# Patient Record
Sex: Female | Born: 1945 | Race: White | Hispanic: No | Smoking: Never smoker
Health system: Southern US, Community
[De-identification: ages and names within clinical notes are randomized; demographics above are authoritative.]

## PROBLEM LIST (undated history)

## (undated) DIAGNOSIS — E785 Hyperlipidemia, unspecified: Secondary | ICD-10-CM

## (undated) DIAGNOSIS — E079 Disorder of thyroid, unspecified: Secondary | ICD-10-CM

## (undated) DIAGNOSIS — I10 Essential (primary) hypertension: Secondary | ICD-10-CM

## (undated) DIAGNOSIS — M8580 Other specified disorders of bone density and structure, unspecified site: Secondary | ICD-10-CM

## (undated) DIAGNOSIS — C801 Malignant (primary) neoplasm, unspecified: Secondary | ICD-10-CM

## (undated) DIAGNOSIS — G43909 Migraine, unspecified, not intractable, without status migrainosus: Secondary | ICD-10-CM

## (undated) DIAGNOSIS — H269 Unspecified cataract: Secondary | ICD-10-CM

## (undated) HISTORY — DX: Unspecified cataract: H26.9

## (undated) HISTORY — DX: Disorder of thyroid, unspecified: E07.9

## (undated) HISTORY — DX: Hyperlipidemia, unspecified: E78.5

## (undated) HISTORY — PX: CATARACT EXTRACTION: SUR2

## (undated) HISTORY — DX: Essential (primary) hypertension: I10

## (undated) HISTORY — DX: Other specified disorders of bone density and structure, unspecified site: M85.80

## (undated) HISTORY — PX: BREAST SURGERY: SHX581

## (undated) HISTORY — DX: Migraine, unspecified, not intractable, without status migrainosus: G43.909

## (undated) HISTORY — PX: COLONOSCOPY: SHX174

---

## 1980-01-05 HISTORY — PX: NECK SURGERY: SHX720

## 2001-08-17 HISTORY — PX: BREAST EXCISIONAL BIOPSY: SUR124

## 2005-02-11 ENCOUNTER — Ambulatory Visit: Payer: Self-pay | Admitting: Internal Medicine

## 2005-02-19 ENCOUNTER — Ambulatory Visit: Payer: Self-pay | Admitting: Internal Medicine

## 2006-05-05 ENCOUNTER — Ambulatory Visit: Payer: Self-pay | Admitting: Internal Medicine

## 2006-10-06 DIAGNOSIS — Z860100 Personal history of colon polyps, unspecified: Secondary | ICD-10-CM | POA: Insufficient documentation

## 2006-12-14 ENCOUNTER — Ambulatory Visit: Payer: Self-pay | Admitting: Internal Medicine

## 2006-12-27 ENCOUNTER — Telehealth (INDEPENDENT_AMBULATORY_CARE_PROVIDER_SITE_OTHER): Payer: Self-pay | Admitting: *Deleted

## 2007-03-20 ENCOUNTER — Ambulatory Visit: Payer: Self-pay | Admitting: Internal Medicine

## 2007-12-04 ENCOUNTER — Encounter: Payer: Self-pay | Admitting: Gynecology

## 2007-12-04 ENCOUNTER — Ambulatory Visit: Payer: Self-pay | Admitting: Gynecology

## 2007-12-04 ENCOUNTER — Other Ambulatory Visit: Admission: RE | Admit: 2007-12-04 | Discharge: 2007-12-04 | Payer: Self-pay | Admitting: Gynecology

## 2007-12-07 ENCOUNTER — Ambulatory Visit: Payer: Self-pay | Admitting: Gynecology

## 2008-01-01 ENCOUNTER — Ambulatory Visit: Payer: Self-pay | Admitting: Family Medicine

## 2008-01-01 DIAGNOSIS — G43909 Migraine, unspecified, not intractable, without status migrainosus: Secondary | ICD-10-CM | POA: Insufficient documentation

## 2008-03-01 ENCOUNTER — Ambulatory Visit: Payer: Self-pay | Admitting: Gynecology

## 2008-03-04 ENCOUNTER — Ambulatory Visit: Payer: Self-pay | Admitting: Gynecology

## 2008-12-09 ENCOUNTER — Encounter (INDEPENDENT_AMBULATORY_CARE_PROVIDER_SITE_OTHER): Payer: Self-pay | Admitting: *Deleted

## 2008-12-09 ENCOUNTER — Other Ambulatory Visit: Admission: RE | Admit: 2008-12-09 | Discharge: 2008-12-09 | Payer: Self-pay | Admitting: Gynecology

## 2008-12-09 ENCOUNTER — Encounter: Payer: Self-pay | Admitting: Internal Medicine

## 2008-12-09 ENCOUNTER — Ambulatory Visit: Payer: Self-pay | Admitting: Gynecology

## 2008-12-24 ENCOUNTER — Encounter: Admission: RE | Admit: 2008-12-24 | Discharge: 2008-12-24 | Payer: Self-pay | Admitting: Gynecology

## 2009-01-10 ENCOUNTER — Encounter (INDEPENDENT_AMBULATORY_CARE_PROVIDER_SITE_OTHER): Payer: Self-pay | Admitting: *Deleted

## 2009-01-10 ENCOUNTER — Ambulatory Visit: Payer: Self-pay | Admitting: Internal Medicine

## 2009-01-21 ENCOUNTER — Encounter (INDEPENDENT_AMBULATORY_CARE_PROVIDER_SITE_OTHER): Payer: Self-pay | Admitting: *Deleted

## 2009-01-24 ENCOUNTER — Ambulatory Visit: Payer: Self-pay | Admitting: Gastroenterology

## 2009-02-07 ENCOUNTER — Ambulatory Visit: Payer: Self-pay | Admitting: Gastroenterology

## 2009-02-25 ENCOUNTER — Ambulatory Visit: Payer: Self-pay | Admitting: Family Medicine

## 2009-02-25 DIAGNOSIS — J029 Acute pharyngitis, unspecified: Secondary | ICD-10-CM

## 2009-02-25 LAB — CONVERTED CEMR LAB: Rapid Strep: NEGATIVE

## 2010-02-03 NOTE — Miscellaneous (Signed)
Summary: [facility]  Clinical Lists Changes  Medications: Added new medication of MOVIPREP 100 GM  SOLR (PEG-KCL-NACL-NASULF-NA ASC-C) As per prep instructions. - Signed Rx of MOVIPREP 100 GM  SOLR (PEG-KCL-NACL-NASULF-NA ASC-C) As per prep instructions.;  #1 x 0;  Signed;  Entered by: Ezra Sites RN;  Authorized by: Louis Meckel MD;  Method used: Electronically to [facility] # [phone]*, [address] [place], [place]  [address], Ph: [phone], Fax: [phone] Allergies: Changed allergy or adverse reaction from BIAXIN to [facility] Changed allergy or adverse reaction from * EVISTA to * EVISTA Added new allergy or adverse reaction of * BACITRACIN EYE DROPS    Prescriptions: MOVIPREP 100 GM  SOLR (PEG-KCL-NACL-NASULF-NA ASC-C) As per prep instructions.  #1 x 0   Entered by:   Ezra Sites RN   Authorized by:   Louis Meckel MD   Signed by:   Ezra Sites RN on 01/24/2009   Method used:   Electronically to        [facility] # [phone]* (retail)       [address]       [place], [place]  [address]       Ph: [phone]       Fax: [phone]   RxID:   [id number]

## 2010-02-03 NOTE — Assessment & Plan Note (Signed)
Summary: fever/sore throat/dm   Vital Signs:  Patient profile:   65 year old female Temp:     99.1 degrees F oral BP sitting:   140 / 60  (left arm) Cuff size:   regular  Vitals Entered By: Sid Falcon LPN (February 25, 2009 9:42 AM) CC: sore throat, fever, cough X 2 days   History of Present Illness: Acute visit. Onset about 3 days ago of sore throat and cough. Cough mostly nonproductive. Yesterday developed sore throat. Fever to 101 last night. Minimal nasal congestion. Mild body aches. Started plain Mucinex yesterday. Patient is nonsmoker. Denies nausea, vomiting, or diarrhea  Allergies: 1)  ! Biaxin 2)  ! * Evista 3)  ! Fish Oil (Omega-3 Fatty Acids) 4)  ! * Bacitracin Eye Drops  Past History:  Past Medical History: Last updated: 01/10/2009 [facility] Colonic polyps, hx of Glaucoma High Cholesterol PMH reviewed for relevance  Review of Systems      See HPI  Physical Exam  General:  Well-developed,well-nourished,in no acute distress; alert,appropriate and cooperative throughout examination Ears:  External ear exam shows no significant lesions or deformities.  Otoscopic examination reveals clear canals, tympanic membranes are intact bilaterally without bulging, retraction, inflammation or discharge. Hearing is grossly normal bilaterally. Nose:  External nasal examination shows no deformity or inflammation. Nasal mucosa are pink and moist without lesions or exudates. Mouth:  minimal erythema without exudate Neck:  No deformities, masses, or tenderness noted. Lungs:  Normal respiratory effort, chest expands symmetrically. Lungs are clear to auscultation, no crackles or wheezes. Heart:  Normal rate and regular rhythm. S1 and S2 normal without gallop, murmur, click, rub or other extra sounds. Skin:  no rashes Cervical Nodes:  No lymphadenopathy noted   Impression & Recommendations:  Problem # 1:  VIRAL INFECTION (ICD-079.99) start mucinex.  No indication for antibiotics  at this time.  Rapid strep neg.  Complete Medication List: 1)  Imitrex 50 Mg Tabs (Sumatriptan succinate) .... Take 1 tablet by mouth once a day as needed 2)  Vitamin D 2000 Unit Tabs (Cholecalciferol) .... Once daily 3)  Zyrtec Allergy 10 Mg Tabs (Cetirizine hcl) .... As needed itching per dr Laney Pastor  Other Orders: Rapid Strep ([address])  Patient Instructions: 1)  Get plenty of rest, drink lots of clear liquids, and use Tylenol or Ibuprofen for fever and comfort. Return in 7-10 days if you're not better: sooner if you'er feeling worse.  2)  continue Mucinex 2 tablets twice daily  Laboratory Results  Date/Time Received: February 25, 2009 9:54 AM  Date/Time Reported: February 25, 2009 9:53 AM   Other Tests  Rapid Strep: negative Comments Wynona Canes, CMA  February 25, 2009 9:54 AM   Kit Test Internal QC: Negative   (Normal Range: Negative)

## 2010-02-03 NOTE — Procedures (Signed)
Summary: Colonoscopy  Patient: [name] Note: All result statuses are Final unless otherwise noted.  Tests: (1) Colonoscopy (COL)   COL Colonoscopy           DONE     [facility]     [address]     [place], [place]  [address]           COLONOSCOPY PROCEDURE REPORT           PATIENT:  Holly Blanchard, Holly Blanchard  MR#:  [id number]     BIRTHDATE:  May 28, 1945, 63 yrs. old  GENDER:  female           ENDOSCOPIST:  Barbette Hair. Arlyce Dice, MD     Referred by:  Birdie Sons, M.D.           PROCEDURE DATE:  02/07/2009     PROCEDURE:  Colonoscopy, Diagnostic     ASA CLASS:  Class I     INDICATIONS:  history of pre-cancerous (adenomatous) colon polyps                 MEDICATIONS:   Fentanyl 75 mcg IV, Versed 8 mg IV           DESCRIPTION OF PROCEDURE:   After the risks benefits and     alternatives of the procedure were thoroughly explained, informed     consent was obtained.  Digital rectal exam was performed and     revealed no abnormalities.   The LB CF-[id number] endoscope     was introduced through the anus and advanced to the cecum, which     was identified by both the appendix and ileocecal valve, without     limitations.  The quality of the prep was excellent, using     MoviPrep.  The instrument was then slowly withdrawn as the colon     was fully examined.     <<PROCEDUREIMAGES>>           FINDINGS:  A normal appearing cecum, ileocecal valve, and     appendiceal orifice were identified. The ascending, hepatic     flexure, transverse, splenic flexure, descending, sigmoid colon,     and rectum appeared unremarkable (see image2, image4, image7,     image8, image9, image12, image14, and image16).   Retroflexed     views in the rectum revealed no abnormalities.    The scope was     then withdrawn from the patient and the procedure completed.           COMPLICATIONS:  None           ENDOSCOPIC IMPRESSION:     1) Normal colon     RECOMMENDATIONS:     1) colonoscopy in 10 years          REPEAT EXAM:  In 10 year(s) for Colonoscopy.           ______________________________     Barbette Hair. Arlyce Dice, MD           CC:           n.     eSIGNED:   Barbette Hair. Inette Doubrava at 02/07/2009 10:14 AM           Charletta Cousin, [id number]  Note: An exclamation mark (!) indicates a result that was not dispersed into the flowsheet. Document Creation Date: 02/07/2009 10:14 AM _______________________________________________________________________  (1) Order result status: Final Collection or observation date-time: 02/07/2009 10:08 Requested date-time:  Receipt date-time:  Reported date-time:  Referring Physician:   Ordering Physician: Melvia Heaps [phone]) Specimen Source:  Source: Launa Grill Order Number: [id number] Lab site:   Appended Document: Colonoscopy    Clinical Lists Changes  Observations: Added new observation of COLONNXTDUE: 02/2019 (02/07/2009 11:12)

## 2010-02-03 NOTE — Assessment & Plan Note (Signed)
Summary: rov/mm   Vital Signs:  Patient profile:   65 year old female Weight:      131 pounds Temp:     98.2 degrees F Pulse rate:   80 / minute Resp:     12 per minute BP sitting:   142 / 80  (left arm)  Vitals Entered By: Gladis Riffle, RN (January 10, 2009 11:00 AM)   History of Present Illness:  Follow-Up Visit      This is a 65 year old woman who presents for Follow-up visit.  The patient denies chest pain, palpitations, dizziness, syncope, edema, SOB, DOE, PND, and orthopnea.  Since the last visit the patient notes being seen by a specialist.  The patient reports taking meds as prescribed.  When questioned about possible medication side effects, the patient notes none.    home BPs 100s-135/70s  Preventive Screening-Counseling & Management  Alcohol-Tobacco     Smoking Status: never  Current Problems (verified): 1)  Migraine Headache  (ICD-346.90) 2)  Colonic Polyps, Hx of  (ICD-V12.72) 3)  Breast Cancer, Hx of  (ICD-V10.3)  Current Medications (verified): 1)  Imitrex 50 Mg Tabs (Sumatriptan Succinate) .... Take 1 Tablet By Mouth Once A Day As Needed 2)  Vitamin D 2000 Unit Tabs (Cholecalciferol) .... Once Daily 3)  Zyrtec Allergy 10 Mg Tabs (Cetirizine Hcl) .... As Needed Itching Per Dr Laney Pastor  Allergies: 1)  ! Biaxin 2)  ! * Evista 3)  ! Fish Oil (Omega-3 Fatty Acids)  Comments:  Nurse/Medical Assistant: elevated BP intermittently x 1 year; BP 138/72-144/72 at home, 107/62 last night--had regular coffee this AM  The patient's medications and allergies were reviewed with the patient and were updated in the Medication and Allergy Lists. Gladis Riffle, RN (January 10, 2009 11:04 AM)  Past History:  Family History: Last updated: 01/10/2009 Family History Diabetes 1st degree relative--father father deceased---lung Family History of Colon CA---aunt Family History Hypertension Family History Lung cancer--father mother--htn  Social History: Last updated:  10/06/2006 Occupation: Married Never Smoked Alcohol use-yes Regular exercise-yes Drug use-no  Risk Factors: Exercise: yes (10/06/2006)  Risk Factors: Smoking Status: never (01/10/2009)  Past Medical History: MHA Colonic polyps, hx of Glaucoma High Cholesterol  Past Surgical History: Breast Bx--2003 benign  Family History: Family History Diabetes 1st degree relative--father father deceased---lung Family History of Colon CA---aunt Family History Hypertension Family History Lung cancer--father mother--htn  Physical Exam  General:  Well-developed,well-nourished,in no acute distress; alert,appropriate and cooperative throughout examination Head:  normocephalic and atraumatic.   Eyes:  pupils equal and pupils round.   Ears:  R ear normal and L ear normal.   Neck:  No deformities, masses, or tenderness noted. Lungs:  rhochi upper chest,  no rales Abdomen:  Bowel sounds positive,abdomen soft and non-tender without masses, organomegaly or hernias noted. Neurologic:  cranial nerves II-XII intact and gait normal.     Impression & Recommendations:  Problem # 1:  MIGRAINE HEADACHE (ICD-346.90) well contrlled continue current medications  Her updated medication list for this problem includes:    Imitrex 50 Mg Tabs (Sumatriptan succinate) .Marland Kitchen... Take 1 tablet by mouth once a day as needed insomnia---trial melatonin  Problem # 3:  COLONIC POLYPS, HX OF (ICD-V12.72) will f/u conolonscopy  Complete Medication List: 1)  Imitrex 50 Mg Tabs (Sumatriptan succinate) .... Take 1 tablet by mouth once a day as needed 2)  Vitamin D 2000 Unit Tabs (Cholecalciferol) .... Once daily 3)  Zyrtec Allergy 10 Mg Tabs (Cetirizine hcl) .Marland KitchenMarland KitchenMarland Kitchen  As needed itching per dr Laney Pastor  Other Orders: Gastroenterology Referral (GI) Prescriptions: IMITREX 50 MG TABS (SUMATRIPTAN SUCCINATE) Take 1 tablet by mouth once a day as needed  #9 x 3   Entered and Authorized by:   Birdie Sons MD   Signed by:   Birdie Sons MD on 01/10/2009   Method used:   Electronically to        [facility] # [phone]* (retail)       [address]       [place], [place]  [address]       Ph: [phone]       Fax: [phone]   RxID:   [id number]    Preventive Care Screening  Bone Density:    Date:  01/05/2008    Next Due:  01/2010    Results:  Dagoberto Reef std dev  Colonoscopy:    Date:  12/05/2002    Next Due:  12/2007    Results:  normal-pt's report   Pap Smear:    Date:  12/04/2008    Next Due:  12/2011    Results:  normal-pt's report

## 2010-02-03 NOTE — Letter (Signed)
Summary: Previsit letter  [facility]  [address] [place], [place] [address]   Phone: [phone]  Fax: [phone]       01/10/2009 MRN: [id number]  [facility] [address] [place], [place]  [address]  Dear Holly Blanchard,  Welcome to the Gastroenterology Division at [facility].    You are scheduled to see a nurse for your pre-procedure visit on 01-24-09 at 11AM on the 3rd floor at [facility], [address]  We ask that you try to arrive at our office 15 minutes prior to your appointment time to allow for check-in.  Your nurse visit will consist of discussing your medical and surgical history, your immediate family medical history, and your medications.    Please bring a complete list of all your medications or, if you prefer, bring the medication bottles and we will list them.  We will need to be aware of both prescribed and over the counter drugs.  We will need to know exact dosage information as well.  If you are on blood thinners (Coumadin, Plavix, Aggrenox, Ticlid, etc.) please call our office today/prior to your appointment, as we need to consult with your physician about holding your medication.   Please be prepared to read and sign documents such as consent forms, a financial agreement, and acknowledgement forms.  If necessary, and with your consent, a friend or relative is welcome to sit-in on the nurse visit with you.  Please bring your insurance card so that we may make a copy of it.  If your insurance requires a referral to see a specialist, please bring your referral form from your primary care physician.  No co-pay is required for this nurse visit.     If you cannot keep your appointment, please call [phone] to cancel or reschedule prior to your appointment date.  This allows [place] the opportunity to schedule an appointment for another patient in need of care.    Thank you for choosing [facility] for your medical needs.  We  appreciate the opportunity to care for you.  Please visit [place] at our website  to learn more about our practice.                     Sincerely.                                                                                                                   The Gastroenterology Division

## 2010-02-03 NOTE — Letter (Signed)
Summary: [facility] Instructions  [facility]  [address] [place], [place] [address]   Phone: [phone]  Fax: [phone]       Holly Blanchard    24-Apr-1945    MRN: [id number]        Procedure Day /Date:  Friday 02/04     Arrival Time:  8:00am     Procedure Time:  9:00am     Location of Procedure:                    _ X_  [facility] (4th Floor)                        PREPARATION FOR COLONOSCOPY WITH MOVIPREP   Starting 5 days prior to your procedure  Sunday 01/30  do not eat nuts, seeds, popcorn, corn, beans, peas,  salads, or any raw vegetables.  Do not take any fiber supplements (e.g. Metamucil, Citrucel, and Benefiber).  THE DAY BEFORE YOUR PROCEDURE         DATE:  02/03   DAY: Thursday 1.  Drink clear liquids the entire day-NO SOLID FOOD  2.  Do not drink anything colored red or purple.  Avoid juices with pulp.  No orange juice.  3.  Drink at least 64 oz. (8 glasses) of fluid/clear liquids during the day to prevent dehydration and help the prep work efficiently.  CLEAR LIQUIDS INCLUDE: Water Jello Ice Popsicles Tea (sugar ok, no milk/cream) Powdered fruit flavored drinks Coffee (sugar ok, no milk/cream) Gatorade Juice: apple, white grape, white cranberry  Lemonade Clear bullion, consomm, broth Carbonated beverages (any kind) Strained chicken noodle soup Hard Candy                             4.  In the morning, mix first dose of MoviPrep solution:    Empty 1 Pouch A and 1 Pouch B into the disposable container    Add lukewarm drinking water to the top line of the container. Mix to dissolve    Refrigerate (mixed solution should be used within 24 hrs)  5.  Begin drinking the prep at 5:00 p.m. The MoviPrep container is divided by 4 marks.   Every 15 minutes drink the solution down to the next mark (approximately 8 oz) until the full liter is complete.   6.  Follow completed prep with 16 oz of clear liquid of your choice (Nothing  red or purple).  Continue to drink clear liquids until bedtime.  7.  Before going to bed, mix second dose of MoviPrep solution:    Empty 1 Pouch A and 1 Pouch B into the disposable container    Add lukewarm drinking water to the top line of the container. Mix to dissolve    Refrigerate  THE DAY OF YOUR PROCEDURE      DATE:  02/04  DAY:  Friday  Beginning at  4:00 a.m. (5 hours before procedure):         1. Every 15 minutes, drink the solution down to the next mark (approx 8 oz) until the full liter is complete.  2. Follow completed prep with 16 oz. of clear liquid of your choice.    3. You may drink clear liquids until  7:00am (2 HOURS BEFORE PROCEDURE).   MEDICATION INSTRUCTIONS  Unless otherwise instructed, you should take regular prescription medications with a small sip of water  as early as possible the morning of your procedure.           OTHER INSTRUCTIONS  You will need a responsible adult at least 65 years of age to accompany you and drive you home.   This person must remain in the waiting room during your procedure.  Wear loose fitting clothing that is easily removed.  Leave jewelry and other valuables at home.  However, you may wish to bring a book to read or  an iPod/MP3 player to listen to music as you wait for your procedure to start.  Remove all body piercing jewelry and leave at home.  Total time from sign-in until discharge is approximately 2-3 hours.  You should go home directly after your procedure and rest.  You can resume normal activities the  day after your procedure.  The day of your procedure you should not:   Drive   Make legal decisions   Operate machinery   Drink alcohol   Return to work  You will receive specific instructions about eating, activities and medications before you leave.    The above instructions have been reviewed and explained to me by   Ezra Sites RN  January 24, 2009 11:25 AM     I fully understand  and can verbalize these instructions _____________________________ Date _________

## 2010-02-19 ENCOUNTER — Other Ambulatory Visit: Payer: Self-pay | Admitting: Gynecology

## 2010-02-19 DIAGNOSIS — Z1231 Encounter for screening mammogram for malignant neoplasm of breast: Secondary | ICD-10-CM

## 2010-02-24 ENCOUNTER — Encounter: Payer: Self-pay | Admitting: Gynecology

## 2010-02-27 ENCOUNTER — Ambulatory Visit
Admission: RE | Admit: 2010-02-27 | Discharge: 2010-02-27 | Disposition: A | Discharge status: Home / Self Care | Payer: Managed Care, Other (non HMO) | Source: Ambulatory Visit | Attending: Gynecology | Admitting: Gynecology

## 2010-02-27 DIAGNOSIS — Z1231 Encounter for screening mammogram for malignant neoplasm of breast: Secondary | ICD-10-CM

## 2010-03-02 ENCOUNTER — Encounter (INDEPENDENT_AMBULATORY_CARE_PROVIDER_SITE_OTHER): Payer: Managed Care, Other (non HMO) | Admitting: Gynecology

## 2010-03-02 ENCOUNTER — Other Ambulatory Visit: Payer: Self-pay | Admitting: Gynecology

## 2010-03-02 ENCOUNTER — Other Ambulatory Visit (HOSPITAL_COMMUNITY)
Admission: RE | Admit: 2010-03-02 | Discharge: 2010-03-02 | Disposition: A | Discharge status: Home / Self Care | Payer: Managed Care, Other (non HMO) | Source: Ambulatory Visit | Attending: Gynecology | Admitting: Gynecology

## 2010-03-02 DIAGNOSIS — Z1211 Encounter for screening for malignant neoplasm of colon: Secondary | ICD-10-CM

## 2010-03-02 DIAGNOSIS — Z124 Encounter for screening for malignant neoplasm of cervix: Secondary | ICD-10-CM | POA: Insufficient documentation

## 2010-03-02 DIAGNOSIS — Z01419 Encounter for gynecological examination (general) (routine) without abnormal findings: Secondary | ICD-10-CM

## 2010-03-24 ENCOUNTER — Encounter (INDEPENDENT_AMBULATORY_CARE_PROVIDER_SITE_OTHER): Payer: Managed Care, Other (non HMO)

## 2010-03-24 DIAGNOSIS — M949 Disorder of cartilage, unspecified: Secondary | ICD-10-CM

## 2011-04-09 DIAGNOSIS — H4010X0 Unspecified open-angle glaucoma, stage unspecified: Secondary | ICD-10-CM | POA: Diagnosis not present

## 2011-04-09 DIAGNOSIS — H04129 Dry eye syndrome of unspecified lacrimal gland: Secondary | ICD-10-CM | POA: Diagnosis not present

## 2011-04-09 DIAGNOSIS — H35039 Hypertensive retinopathy, unspecified eye: Secondary | ICD-10-CM | POA: Diagnosis not present

## 2011-04-09 DIAGNOSIS — H2510 Age-related nuclear cataract, unspecified eye: Secondary | ICD-10-CM | POA: Diagnosis not present

## 2011-08-31 DIAGNOSIS — L821 Other seborrheic keratosis: Secondary | ICD-10-CM | POA: Diagnosis not present

## 2011-08-31 DIAGNOSIS — L259 Unspecified contact dermatitis, unspecified cause: Secondary | ICD-10-CM | POA: Diagnosis not present

## 2011-08-31 DIAGNOSIS — S93609A Unspecified sprain of unspecified foot, initial encounter: Secondary | ICD-10-CM | POA: Diagnosis not present

## 2011-08-31 DIAGNOSIS — D239 Other benign neoplasm of skin, unspecified: Secondary | ICD-10-CM | POA: Diagnosis not present

## 2011-09-22 ENCOUNTER — Other Ambulatory Visit: Payer: Self-pay | Admitting: Gynecology

## 2011-09-22 DIAGNOSIS — Z1231 Encounter for screening mammogram for malignant neoplasm of breast: Secondary | ICD-10-CM

## 2011-10-13 ENCOUNTER — Ambulatory Visit
Admission: RE | Admit: 2011-10-13 | Discharge: 2011-10-13 | Disposition: A | Discharge status: Home / Self Care | Payer: Medicare Other | Source: Ambulatory Visit | Attending: Gynecology | Admitting: Gynecology

## 2011-10-13 DIAGNOSIS — Z1231 Encounter for screening mammogram for malignant neoplasm of breast: Secondary | ICD-10-CM | POA: Diagnosis not present

## 2011-10-18 ENCOUNTER — Encounter: Payer: Self-pay | Admitting: Gynecology

## 2011-10-18 ENCOUNTER — Ambulatory Visit (INDEPENDENT_AMBULATORY_CARE_PROVIDER_SITE_OTHER): Payer: Medicare Other | Admitting: Gynecology

## 2011-10-18 VITALS — BP 122/74 | Ht 64.25 in | Wt 134.0 lb

## 2011-10-18 DIAGNOSIS — M899 Disorder of bone, unspecified: Secondary | ICD-10-CM

## 2011-10-18 DIAGNOSIS — E559 Vitamin D deficiency, unspecified: Secondary | ICD-10-CM

## 2011-10-18 DIAGNOSIS — N952 Postmenopausal atrophic vaginitis: Secondary | ICD-10-CM | POA: Insufficient documentation

## 2011-10-18 DIAGNOSIS — M949 Disorder of cartilage, unspecified: Secondary | ICD-10-CM | POA: Diagnosis not present

## 2011-10-18 DIAGNOSIS — M8580 Other specified disorders of bone density and structure, unspecified site: Secondary | ICD-10-CM | POA: Insufficient documentation

## 2011-10-18 MED ORDER — ESTRADIOL 10 MCG VA TABS: 1.0000 | ORAL_TABLET | VAGINAL | Status: DC

## 2011-10-18 NOTE — Patient Instructions (Signed)

## 2011-10-18 NOTE — Progress Notes (Addendum)
Kaydra Borgen 1945-10-03 [id number]   History:    66 y.o. who is been a year and a half later for gynecological examination. Patient denies any prior history of abnormal Pap smears. Her primary care physician is Dr. Birdie Sons of his been doing her lab work. Last year she was borderline hypertensive currently on no medication and her blood pressure today was 122/74. Review of her record indicated that her last bone density study was in our office on 03/24/2010 with her lowest T score at the right femoral neck -1.6 (decreased bone mineralization/osteopenia). Would compare with previous study 2010 there was nonsignificant changes. Her Frax analysis indicated her 10 year probability of any major osteoporotic fracture was 9 %  and risk of hip fracture 1% both sub-threshold. She has had history vitamin D deficiency in the past. She is taking calcium and vitamin D for osteoporosis prevention.  Her last colonoscopy was in 2011 reportedly normal (history of colonic polyps 2005 benign). 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 She is did not receive the shingles vaccine. Her last mammogram was this year which was benign. Patient with no prior history of abnormal Pap smears.   Review of patient's record indicated that in [place] in 2003 patient had a left breast biopsy with a pathology report indicating moderately intraductal epithelial hyperplasia with foci of calcification no neoplasm was seen.  Past medical history,surgical history, family history and social history were all reviewed and documented in the EPIC chart.  Gynecologic History No LMP recorded. Patient is postmenopausal. Contraception: Postmenopausal Last Pap: 2012. Results were: normal Last mammogram: 2013. Results were: normal  Obstetric History OB History    Grav Para Term Preterm Abortions TAB SAB Ect Mult Living   2 1   1  1   1      # Outc Date GA Lbr Len/2nd Wgt Sex Del  Anes PTL Lv   1 PAR            2 SAB                ROS: A ROS was performed and pertinent positives and negatives are included in the history.  GENERAL: No fevers or chills. HEENT: No change in vision, no earache, sore throat or sinus congestion. NECK: No pain or stiffness. CARDIOVASCULAR: No chest pain or pressure. No palpitations. PULMONARY: No shortness of breath, cough or wheeze. GASTROINTESTINAL: No abdominal pain, nausea, vomiting or diarrhea, melena or bright red blood per rectum. GENITOURINARY: No urinary frequency, urgency, hesitancy or dysuria. MUSCULOSKELETAL: No joint or muscle pain, no back pain, no recent trauma. DERMATOLOGIC: No rash, no itching, no lesions. ENDOCRINE: No polyuria, polydipsia, no heat or cold intolerance. No recent change in weight. HEMATOLOGICAL: No anemia or easy bruising or bleeding. NEUROLOGIC: No headache, seizures, numbness, tingling or weakness. PSYCHIATRIC: No depression, no loss of interest in normal activity or change in sleep pattern.     Exam: chaperone present  BP 122/74  Ht 5' 4.25" (1.632 m)  Wt 134 lb (60.782 kg)  BMI 22.82 kg/m2  Body mass index is 22.82 kg/(m^2).  General appearance : Well developed well nourished female. No acute distress HEENT: Neck supple, trachea midline, no carotid bruits, no thyroidmegaly Lungs: Clear to auscultation, no rhonchi or wheezes, or rib retractions  Heart: Regular rate and rhythm, no murmurs or gallops Breast:Examined in sitting and supine position were symmetrical in appearance, no palpable masses or tenderness,  no  skin retraction, no nipple inversion, no nipple discharge, no skin discoloration, no axillary or supraclavicular lymphadenopathy Abdomen: no palpable masses or tenderness, no rebound or guarding Extremities: no edema or skin discoloration or tenderness  Pelvic:  Bartholin, Urethra, Skene Glands: Within normal limits             Vagina: No gross lesions or discharge, atrophic  changes  Cervix: No gross lesions or discharge  Uterus  axial, normal size, shape and consistency, non-tender and mobile  Adnexa  Without masses or tenderness  Anus and perineum  normal   Rectovaginal  normal sphincter tone without palpated masses or tenderness             Hemoccult cards provided her to symmetrical the office for testing     Assessment/Plan:  67 y.o. female with history of osteopenia and vitamin D deficiency in the past. Bone density study up-to-date. Her Threasa Beards /DEXA scores does not justify her being placed on any antiresorptive agent at the present time. She will continue with Caltrate plus twice a day but have asked her to decrease her vitamin D to 1000 units daily. Hemoccult cards were presented for her to submit to the office for testing later. We will check her vitamin D level today. Dr. Cato Mulligan  we'll be doing her other lab work a later date. She'll be switched from Estrace vaginal cream 2 Vagifem 10 mcg vaginal tablet twice a week. We did discuss that there is a slight risk of breast cancer but very low with this low dose topical tablet. There is very little systemic absorption. She was instructed to engage in weightbearing exercises 3-4 times a week for osteoporosis prevention. She was reminded to continue her monthly self breast examination. Literature information on shingles vaccine and on Tdap were provided as well for her to discuss with her primary. No Pap smear done today new screening guidelines discussed. She is over 19 years of age and for release 20 years has had no history of high risk dysplasia and thus would've longer needs Pap smears.    Ok Edwards MD, 9:53 AM 10/18/2011

## 2011-10-22 ENCOUNTER — Encounter: Payer: Self-pay | Admitting: Gynecology

## 2012-04-13 DIAGNOSIS — B029 Zoster without complications: Secondary | ICD-10-CM | POA: Diagnosis not present

## 2012-05-07 ENCOUNTER — Encounter (HOSPITAL_COMMUNITY): Payer: Self-pay | Admitting: Emergency Medicine

## 2012-05-07 ENCOUNTER — Emergency Department (INDEPENDENT_AMBULATORY_CARE_PROVIDER_SITE_OTHER)
Admission: EM | Admit: 2012-05-07 | Discharge: 2012-05-07 | Disposition: A | Discharge status: Home / Self Care | Payer: Medicare Other | Source: Home / Self Care | Attending: Emergency Medicine | Admitting: Emergency Medicine

## 2012-05-07 DIAGNOSIS — IMO0002 Reserved for concepts with insufficient information to code with codable children: Secondary | ICD-10-CM | POA: Diagnosis not present

## 2012-05-07 DIAGNOSIS — M5416 Radiculopathy, lumbar region: Secondary | ICD-10-CM

## 2012-05-07 MED ORDER — HYDROCODONE-IBUPROFEN 7.5-200 MG PO TABS: 1.0000 | ORAL_TABLET | Freq: Three times a day (TID) | ORAL | Status: DC | PRN

## 2012-05-07 NOTE — ED Notes (Signed)
Waiting discharge papers 

## 2012-05-07 NOTE — ED Provider Notes (Signed)
History     CSN: [id number]  Arrival date & time 05/07/12  1109   First MD Initiated Contact with Patient 05/07/12 1123      Chief Complaint  Patient presents with  . Leg Pain    leg cramps since tuesday. and numbness in 4th/5th toes.    (Consider location/radiation/quality/duration/timing/severity/associated sxs/prior treatment) HPI Comments: Patient presents urgent care describing that for approximately 4 days she's been having this " cramping type pain that starts in her left lower back most along the lateral and anterior aspect of her left upper leg ... going through her left knee to the lateral aspect of the left lower extremity to the fifth and fourth toe where she feels a numbness and tingling sensation. Pain is exacerbated by walking putting weight on her left leg. Pain exacerbates as well when she leans forward in her left lower back. She denies any weakness, of her lower extremities denies any urinary or fecal incontinence or perineal numbness. Patient also denies any constitutional symptoms such as fevers malaise unintentional weight loss or arthralgias and myalgias. Patient describes that she recently was diagnosed with shingles on the opposite side of her back where she developed several painful and burning rashes which she took a sickly therefore appeared that has since then improved but she described that make her right leg hurt at that time.  Patient denies any history of DVTs, PEs, no recent surgery no recent long-term traveling air or land. Denies any swelling per se of her left lower extremity.  Patient is a 67 y.o. female presenting with leg pain. The history is provided by the patient.  Leg Pain Location:  Leg and buttock Time since incident:  4 days Buttock location:  L buttock Leg location:  L leg, L lower leg and L upper leg Pain details:    Quality:  Aching, pressure and tingling   Radiates to:  Back   Severity:  Moderate   Onset quality:  Gradual   Timing:   Constant   Progression:  Worsening Chronicity:  New Dislocation: no   Prior injury to area:  No Relieved by:  Rest Worsened by:  Bearing weight, activity, extension, flexion and exercise Associated symptoms: back pain, decreased ROM and numbness   Associated symptoms: no fatigue, no fever, no itching, no muscle weakness, no neck pain, no stiffness and no swelling   Risk factors: recent illness   Risk factors: no concern for non-accidental trauma     Past Medical History  Diagnosis Date  . Migraines   . Osteopenia     Past Surgical History  Procedure Laterality Date  . Neck surgery  1982    right side- lymphnode removed    Family History  Problem Relation Age of Onset  . Hypertension Mother   . Breast cancer Mother 31    lump removed only  . Cancer Maternal Aunt     colon    History  Substance Use Topics  . Smoking status: Never Smoker   . Smokeless tobacco: Never Used  . Alcohol Use: Yes     Comment: wine    OB History   Grav Para Term Preterm Abortions TAB SAB Ect Mult Living   2 1   1  1   1       Review of Systems  Constitutional: Positive for activity change. Negative for fever, diaphoresis, appetite change and fatigue.  HENT: Negative for neck pain.   Musculoskeletal: Positive for back pain. Negative for myalgias, joint swelling  and stiffness.  Skin: Negative for itching, pallor and rash.  Neurological: Positive for numbness. Negative for tremors, seizures, weakness and headaches.    Allergies  Biaxin; Fish oil; and Raloxifene  Home Medications   Current Outpatient Rx  Name  Route  Sig  Dispense  Refill  . calcium carbonate (OS-CAL) 600 MG TABS   Oral   Take 600 mg by mouth 2 (two) times daily with a meal.         . cholecalciferol (VITAMIN D) 1000 UNITS tablet   Oral   Take 1,000 Units by mouth daily.         . SUMAtriptan (IMITREX) 25 MG tablet   Oral   Take 25 mg by mouth every 2 (two) hours as needed.         . Estradiol  (VAGIFEM) 10 MCG TABS   Vaginal   Place 1 tablet (10 mcg total) vaginally 2 (two) times a week.   8 tablet   11   . HYDROcodone-ibuprofen (VICOPROFEN) 7.5-200 MG per tablet   Oral   Take 1 tablet by mouth every 8 (eight) hours as needed for pain.   15 tablet   0     BP 180/79  Pulse 74  Temp(Src) 98.5 F (36.9 C) (Oral)  Resp 17  SpO2 99%  Physical Exam  Nursing note and vitals reviewed. Constitutional: Vital signs are normal. She appears well-developed and well-nourished.  Non-toxic appearance. She does not have a sickly appearance. She does not appear ill. No distress.  Abdominal: Soft. There is no tenderness.  Musculoskeletal: She exhibits tenderness.       Legs: Neurological: She is alert. She has normal strength. No sensory deficit. She exhibits normal muscle tone.  Reflex Scores:      Patellar reflexes are 3+ on the right side and 3+ on the left side.      Achilles reflexes are 3+ on the right side and 3+ on the left side. Skin: No erythema.    ED Course  Procedures (including critical care time)  Labs Reviewed - No data to display No results found.   1. Lumbar radiculopathy, acute       MDM  Current exam and symptoms resemble a radiculopathy of S1- pattern, without any motor dysfunction or distal vascular deficits. Have discussed with patient symptoms that should warrant immediate attention or further evaluation emergent department and also to followup with the orthopedic Dr. if no improvement. At this point the rest of a DVT, patient had a low probability based on modify Wells criteria, but have advised patient to seek medical attention if changes as discussed. Patient agrees with treatment plan and follow-up care as discussed  Prescription of Vicoprofen (15 tablets)   Jimmie Molly, MD 05/07/12 [phone]

## 2012-05-07 NOTE — ED Notes (Signed)
Pt c/o cramping in in left leg. And numbness in 4th and 5th toe.  Pain is felt behind knee and calf. Cramping sensation radiates through leg.  No hx of DVT. Recently had a case of shingles.  Pt has used epsom salt soaks with no relief.

## 2012-05-26 DIAGNOSIS — IMO0002 Reserved for concepts with insufficient information to code with codable children: Secondary | ICD-10-CM | POA: Diagnosis not present

## 2012-06-04 DIAGNOSIS — M47817 Spondylosis without myelopathy or radiculopathy, lumbosacral region: Secondary | ICD-10-CM | POA: Diagnosis not present

## 2012-06-07 DIAGNOSIS — IMO0002 Reserved for concepts with insufficient information to code with codable children: Secondary | ICD-10-CM | POA: Diagnosis not present

## 2012-06-08 ENCOUNTER — Other Ambulatory Visit: Payer: Self-pay | Admitting: Orthopedic Surgery

## 2012-06-12 ENCOUNTER — Encounter (HOSPITAL_COMMUNITY): Payer: Self-pay | Admitting: Pharmacy Technician

## 2012-06-16 ENCOUNTER — Encounter (HOSPITAL_COMMUNITY): Payer: Self-pay

## 2012-06-16 ENCOUNTER — Encounter (HOSPITAL_COMMUNITY)
Admission: RE | Admit: 2012-06-16 | Discharge: 2012-06-16 | Disposition: A | Discharge status: Home / Self Care | Payer: Medicare Other | Source: Ambulatory Visit | Attending: Orthopedic Surgery | Admitting: Orthopedic Surgery

## 2012-06-16 DIAGNOSIS — Z01818 Encounter for other preprocedural examination: Secondary | ICD-10-CM | POA: Diagnosis not present

## 2012-06-16 DIAGNOSIS — R51 Headache: Secondary | ICD-10-CM | POA: Diagnosis not present

## 2012-06-16 DIAGNOSIS — M79609 Pain in unspecified limb: Secondary | ICD-10-CM | POA: Diagnosis not present

## 2012-06-16 DIAGNOSIS — Z85828 Personal history of other malignant neoplasm of skin: Secondary | ICD-10-CM | POA: Diagnosis not present

## 2012-06-16 DIAGNOSIS — M949 Disorder of cartilage, unspecified: Secondary | ICD-10-CM | POA: Diagnosis not present

## 2012-06-16 DIAGNOSIS — Z8249 Family history of ischemic heart disease and other diseases of the circulatory system: Secondary | ICD-10-CM | POA: Diagnosis not present

## 2012-06-16 DIAGNOSIS — M899 Disorder of bone, unspecified: Secondary | ICD-10-CM | POA: Diagnosis not present

## 2012-06-16 DIAGNOSIS — Z79899 Other long term (current) drug therapy: Secondary | ICD-10-CM | POA: Diagnosis not present

## 2012-06-16 DIAGNOSIS — M5126 Other intervertebral disc displacement, lumbar region: Secondary | ICD-10-CM | POA: Diagnosis not present

## 2012-06-16 DIAGNOSIS — Z800 Family history of malignant neoplasm of digestive organs: Secondary | ICD-10-CM | POA: Diagnosis not present

## 2012-06-16 DIAGNOSIS — Z888 Allergy status to other drugs, medicaments and biological substances status: Secondary | ICD-10-CM | POA: Diagnosis not present

## 2012-06-16 DIAGNOSIS — Z881 Allergy status to other antibiotic agents status: Secondary | ICD-10-CM | POA: Diagnosis not present

## 2012-06-16 HISTORY — DX: Malignant (primary) neoplasm, unspecified: C80.1

## 2012-06-16 LAB — COMPREHENSIVE METABOLIC PANEL
ALT: 20 U/L (ref 0–35)
Albumin: 4.1 g/dL (ref 3.5–5.2)
Alkaline Phosphatase: 59 U/L (ref 39–117)
GFR calc Af Amer: 76 mL/min — ABNORMAL LOW (ref >90)
Glucose, Bld: 89 mg/dL (ref 70–99)
Potassium: 3.7 mEq/L (ref 3.5–5.1)
Sodium: 139 mEq/L (ref 135–145)
Total Protein: 6.9 g/dL (ref 6.0–8.3)

## 2012-06-16 LAB — SURGICAL PCR SCREEN
MRSA, PCR: NEGATIVE
Staphylococcus aureus: NEGATIVE

## 2012-06-16 LAB — URINALYSIS, ROUTINE W REFLEX MICROSCOPIC
Leukocytes, UA: NEGATIVE
Nitrite: NEGATIVE
Specific Gravity, Urine: 1.02 (ref 1.005–1.030)
Urobilinogen, UA: 1 mg/dL (ref 0.0–1.0)

## 2012-06-16 LAB — TYPE AND SCREEN: Antibody Screen: NEGATIVE

## 2012-06-16 LAB — APTT: aPTT: 26 seconds (ref 24–37)

## 2012-06-16 LAB — CBC WITH DIFFERENTIAL/PLATELET
Eosinophils Absolute: 0 10*3/uL (ref 0.0–0.7)
Lymphs Abs: 1.8 10*3/uL (ref 0.7–4.0)
MCH: 34.7 pg — ABNORMAL HIGH (ref 26.0–34.0)
Neutro Abs: 3.6 10*3/uL (ref 1.7–7.7)
Neutrophils Relative %: 62 % (ref 43–77)
Platelets: 243 10*3/uL (ref 150–400)
RBC: 4.12 MIL/uL (ref 3.87–5.11)
WBC: 5.8 10*3/uL (ref 4.0–10.5)

## 2012-06-16 LAB — ABO/RH: ABO/RH(D): A NEG

## 2012-06-16 NOTE — Pre-Procedure Instructions (Signed)
Holly Blanchard  06/16/2012   Your procedure is scheduled on:  Wednesday, June 18th.  Report to [facility] at 6:30AM. Enter through [facility], take [place] Elevators to 3rd Floor Elevators --Short Stay.  Call this number if you have problems the morning of surgery: [phone]   Remember:   Do not eat food or drink liquids after midnight.   Take these medicines the morning of surgery with A SIP OF WATER:   May take Pain medication if needed.   Do not wear jewelry, make-up or nail polish.  Do not wear lotions, powders, or perfumes. You may wear deodorant.  Do not shave 48 hours prior to surgery.  Do not bring valuables to the hospital.  [facility] is not responsible for any belongings or valuables.  Contacts, dentures or bridgework may not be worn into surgery.  Leave suitcase in the car. After surgery it may be brought to your room.  For patients admitted to the hospital, checkout time is 11:00 AM the day of discharge.   Patients discharged the day of surgery will not be allowed to drive home.  Name and phone number of your driver: -   Special Instructions: Shower using CHG 2 nights before surgery and the night before surgery.  If you shower the day of surgery use CHG.  Use special wash - you have one bottle of CHG for all showers.  You should use approximately 1/3 of the bottle for each shower.   Please read over the following fact sheets that you were given: Pain Booklet, Coughing and Deep Breathing, Blood Transfusion Information and Surgical Site Infection Prevention

## 2012-06-20 MED ORDER — CEFAZOLIN SODIUM-DEXTROSE 2-3 GM-% IV SOLR
2.0000 g | INTRAVENOUS | Status: AC
  Administered 2012-06-21: 2 g via INTRAVENOUS
  Filled 2012-06-20: qty 50

## 2012-06-21 ENCOUNTER — Ambulatory Visit (HOSPITAL_COMMUNITY): Payer: Medicare Other | Admitting: Anesthesiology

## 2012-06-21 ENCOUNTER — Ambulatory Visit (HOSPITAL_COMMUNITY)
Admission: RE | Admit: 2012-06-21 | Discharge: 2012-06-21 | Disposition: A | Discharge status: Home / Self Care | Payer: Medicare Other | Source: Ambulatory Visit | Attending: Orthopedic Surgery | Admitting: Orthopedic Surgery

## 2012-06-21 ENCOUNTER — Encounter (HOSPITAL_COMMUNITY)
Admission: RE | Disposition: A | Discharge status: Home / Self Care | Payer: Self-pay | Source: Ambulatory Visit | Attending: Orthopedic Surgery

## 2012-06-21 ENCOUNTER — Ambulatory Visit: Payer: Self-pay

## 2012-06-21 ENCOUNTER — Ambulatory Visit (HOSPITAL_COMMUNITY): Payer: Medicare Other

## 2012-06-21 ENCOUNTER — Encounter (HOSPITAL_COMMUNITY): Payer: Self-pay | Admitting: Anesthesiology

## 2012-06-21 ENCOUNTER — Encounter (HOSPITAL_COMMUNITY): Payer: Self-pay | Admitting: *Deleted

## 2012-06-21 DIAGNOSIS — M79609 Pain in unspecified limb: Secondary | ICD-10-CM | POA: Diagnosis not present

## 2012-06-21 DIAGNOSIS — M949 Disorder of cartilage, unspecified: Secondary | ICD-10-CM | POA: Insufficient documentation

## 2012-06-21 DIAGNOSIS — M539 Dorsopathy, unspecified: Secondary | ICD-10-CM | POA: Diagnosis not present

## 2012-06-21 DIAGNOSIS — Z85828 Personal history of other malignant neoplasm of skin: Secondary | ICD-10-CM | POA: Diagnosis not present

## 2012-06-21 DIAGNOSIS — Z79899 Other long term (current) drug therapy: Secondary | ICD-10-CM | POA: Insufficient documentation

## 2012-06-21 DIAGNOSIS — Z8249 Family history of ischemic heart disease and other diseases of the circulatory system: Secondary | ICD-10-CM | POA: Insufficient documentation

## 2012-06-21 DIAGNOSIS — M5126 Other intervertebral disc displacement, lumbar region: Secondary | ICD-10-CM | POA: Insufficient documentation

## 2012-06-21 DIAGNOSIS — IMO0002 Reserved for concepts with insufficient information to code with codable children: Secondary | ICD-10-CM | POA: Diagnosis not present

## 2012-06-21 DIAGNOSIS — Z888 Allergy status to other drugs, medicaments and biological substances status: Secondary | ICD-10-CM | POA: Insufficient documentation

## 2012-06-21 DIAGNOSIS — M899 Disorder of bone, unspecified: Secondary | ICD-10-CM | POA: Diagnosis not present

## 2012-06-21 DIAGNOSIS — Z800 Family history of malignant neoplasm of digestive organs: Secondary | ICD-10-CM | POA: Insufficient documentation

## 2012-06-21 DIAGNOSIS — Z881 Allergy status to other antibiotic agents status: Secondary | ICD-10-CM | POA: Insufficient documentation

## 2012-06-21 DIAGNOSIS — R51 Headache: Secondary | ICD-10-CM | POA: Insufficient documentation

## 2012-06-21 HISTORY — PX: LUMBAR LAMINECTOMY/DECOMPRESSION MICRODISCECTOMY: SHX5026

## 2012-06-21 SURGERY — LUMBAR LAMINECTOMY/DECOMPRESSION MICRODISCECTOMY
Anesthesia: General | Site: Spine Lumbar | Laterality: Left | Wound class: Clean

## 2012-06-21 MED ORDER — METHYLPREDNISOLONE ACETATE 40 MG/ML IJ SUSP
INTRAMUSCULAR | Status: AC
  Filled 2012-06-21: qty 1

## 2012-06-21 MED ORDER — BUPIVACAINE-EPINEPHRINE 0.25% -1:200000 IJ SOLN
INTRAMUSCULAR | Status: DC | PRN
  Administered 2012-06-21: 5 mL

## 2012-06-21 MED ORDER — POVIDONE-IODINE 7.5 % EX SOLN: Freq: Once | CUTANEOUS | Status: DC

## 2012-06-21 MED ORDER — THROMBIN 20000 UNITS EX SOLR
CUTANEOUS | Status: DC | PRN
  Administered 2012-06-21: 09:00:00 via TOPICAL

## 2012-06-21 MED ORDER — PHENYLEPHRINE HCL 10 MG/ML IJ SOLN
INTRAMUSCULAR | Status: DC | PRN
  Administered 2012-06-21 (×10): 80 ug via INTRAVENOUS

## 2012-06-21 MED ORDER — LIDOCAINE HCL (CARDIAC) 20 MG/ML IV SOLN
INTRAVENOUS | Status: DC | PRN
  Administered 2012-06-21: 100 mg via INTRAVENOUS

## 2012-06-21 MED ORDER — ROCURONIUM BROMIDE 100 MG/10ML IV SOLN
INTRAVENOUS | Status: DC | PRN
  Administered 2012-06-21: 5 mg via INTRAVENOUS
  Administered 2012-06-21: 30 mg via INTRAVENOUS

## 2012-06-21 MED ORDER — INDIGOTINDISULFONATE SODIUM 8 MG/ML IJ SOLN
INTRAMUSCULAR | Status: AC
  Filled 2012-06-21: qty 5

## 2012-06-21 MED ORDER — LACTATED RINGERS IV SOLN
INTRAVENOUS | Status: DC | PRN
  Administered 2012-06-21 (×2): via INTRAVENOUS

## 2012-06-21 MED ORDER — HYDROMORPHONE HCL PF 1 MG/ML IJ SOLN: 0.2500 mg | INTRAMUSCULAR | Status: DC | PRN

## 2012-06-21 MED ORDER — FENTANYL CITRATE 0.05 MG/ML IJ SOLN
INTRAMUSCULAR | Status: DC | PRN
  Administered 2012-06-21 (×3): 50 ug via INTRAVENOUS
  Administered 2012-06-21: 100 ug via INTRAVENOUS

## 2012-06-21 MED ORDER — ONDANSETRON HCL 4 MG/2ML IJ SOLN
INTRAMUSCULAR | Status: DC | PRN
  Administered 2012-06-21: 4 mg via INTRAVENOUS

## 2012-06-21 MED ORDER — BUPIVACAINE-EPINEPHRINE PF 0.25-1:200000 % IJ SOLN
INTRAMUSCULAR | Status: AC
  Filled 2012-06-21: qty 30

## 2012-06-21 MED ORDER — THROMBIN 20000 UNITS EX SOLR
CUTANEOUS | Status: AC
  Filled 2012-06-21: qty 20000

## 2012-06-21 MED ORDER — METHYLPREDNISOLONE ACETATE 40 MG/ML IJ SUSP
INTRAMUSCULAR | Status: DC | PRN
  Administered 2012-06-21: 40 mg

## 2012-06-21 MED ORDER — EPHEDRINE SULFATE 50 MG/ML IJ SOLN
INTRAMUSCULAR | Status: DC | PRN
  Administered 2012-06-21 (×2): 10 mg via INTRAVENOUS

## 2012-06-21 MED ORDER — ONDANSETRON HCL 4 MG/2ML IJ SOLN: 4.0000 mg | Freq: Once | INTRAMUSCULAR | Status: DC | PRN

## 2012-06-21 MED ORDER — PROPOFOL 10 MG/ML IV BOLUS
INTRAVENOUS | Status: DC | PRN
  Administered 2012-06-21: 200 mg via INTRAVENOUS

## 2012-06-21 MED ORDER — NEOSTIGMINE METHYLSULFATE 1 MG/ML IJ SOLN
INTRAMUSCULAR | Status: DC | PRN
  Administered 2012-06-21: 3 mg via INTRAVENOUS

## 2012-06-21 MED ORDER — INDIGOTINDISULFONATE SODIUM 8 MG/ML IJ SOLN
INTRAMUSCULAR | Status: DC | PRN
  Administered 2012-06-21: 1 mL

## 2012-06-21 MED ORDER — GLYCOPYRROLATE 0.2 MG/ML IJ SOLN
INTRAMUSCULAR | Status: DC | PRN
  Administered 2012-06-21: 0.2 mg via INTRAVENOUS
  Administered 2012-06-21: 0.4 mg via INTRAVENOUS

## 2012-06-21 MED ORDER — ARTIFICIAL TEARS OP OINT
TOPICAL_OINTMENT | OPHTHALMIC | Status: DC | PRN
  Administered 2012-06-21: 1 via OPHTHALMIC

## 2012-06-21 SURGICAL SUPPLY — 62 items
BENZOIN TINCTURE PRP APPL 2/3 (GAUZE/BANDAGES/DRESSINGS) ×2 IMPLANT
BUR ROUND PRECISION 4.0 (BURR) ×2 IMPLANT
CANISTER SUCTION 2500CC (MISCELLANEOUS) ×2 IMPLANT
CLOSURE STERI-STRIP 1/4X4 (GAUZE/BANDAGES/DRESSINGS) ×2 IMPLANT
CLOTH BEACON ORANGE TIMEOUT ST (SAFETY) ×2 IMPLANT
CORDS BIPOLAR (ELECTRODE) ×2 IMPLANT
COVER SURGICAL LIGHT HANDLE (MISCELLANEOUS) ×2 IMPLANT
DRAIN CHANNEL 15F RND FF W/TCR (WOUND CARE) IMPLANT
DRAPE POUCH INSTRU U-SHP 10X18 (DRAPES) ×4 IMPLANT
DRAPE SURG 17X23 STRL (DRAPES) ×8 IMPLANT
DURAPREP 26ML APPLICATOR (WOUND CARE) ×2 IMPLANT
ELECT BLADE 4.0 EZ CLEAN MEGAD (MISCELLANEOUS)
ELECT CAUTERY BLADE 6.4 (BLADE) ×2 IMPLANT
ELECT REM PT RETURN 9FT ADLT (ELECTROSURGICAL) ×2
ELECTRODE BLDE 4.0 EZ CLN MEGD (MISCELLANEOUS) IMPLANT
ELECTRODE REM PT RTRN 9FT ADLT (ELECTROSURGICAL) ×1 IMPLANT
EVACUATOR SILICONE 100CC (DRAIN) IMPLANT
FILTER STRAW FLUID ASPIR (MISCELLANEOUS) ×2 IMPLANT
GAUZE SPONGE 4X4 16PLY XRAY LF (GAUZE/BANDAGES/DRESSINGS) ×4 IMPLANT
GLOVE BIO SURGEON STRL SZ7 (GLOVE) ×2 IMPLANT
GLOVE BIO SURGEON STRL SZ8 (GLOVE) ×2 IMPLANT
GLOVE BIOGEL PI IND STRL 7.0 (GLOVE) ×1 IMPLANT
GLOVE BIOGEL PI IND STRL 8 (GLOVE) ×1 IMPLANT
GLOVE BIOGEL PI INDICATOR 7.0 (GLOVE) ×1
GLOVE BIOGEL PI INDICATOR 8 (GLOVE) ×1
GOWN STRL NON-REIN LRG LVL3 (GOWN DISPOSABLE) ×4 IMPLANT
GOWN STRL REIN XL XLG (GOWN DISPOSABLE) ×2 IMPLANT
IV CATH 14GX2 1/4 (CATHETERS) ×2 IMPLANT
KIT BASIN OR (CUSTOM PROCEDURE TRAY) ×2 IMPLANT
KIT ROOM TURNOVER OR (KITS) ×2 IMPLANT
NEEDLE 18GX1X1/2 (RX/OR ONLY) (NEEDLE) ×2 IMPLANT
NEEDLE 22X1 1/2 (OR ONLY) (NEEDLE) IMPLANT
NEEDLE HYPO 25GX1X1/2 BEV (NEEDLE) ×2 IMPLANT
NEEDLE SPNL 18GX3.5 QUINCKE PK (NEEDLE) ×4 IMPLANT
NS IRRIG 1000ML POUR BTL (IV SOLUTION) ×2 IMPLANT
PACK LAMINECTOMY ORTHO (CUSTOM PROCEDURE TRAY) ×2 IMPLANT
PACK UNIVERSAL I (CUSTOM PROCEDURE TRAY) ×2 IMPLANT
PAD ARMBOARD 7.5X6 YLW CONV (MISCELLANEOUS) ×4 IMPLANT
PATTIES SURGICAL .5 X.5 (GAUZE/BANDAGES/DRESSINGS) IMPLANT
PATTIES SURGICAL .5 X1 (DISPOSABLE) ×2 IMPLANT
SPONGE GAUZE 4X4 12PLY (GAUZE/BANDAGES/DRESSINGS) ×2 IMPLANT
SPONGE SURGIFOAM ABS GEL 100 (HEMOSTASIS) ×2 IMPLANT
STRIP CLOSURE SKIN 1/2X4 (GAUZE/BANDAGES/DRESSINGS) IMPLANT
SURGIFLO TRUKIT (HEMOSTASIS) IMPLANT
SUT MNCRL AB 4-0 PS2 18 (SUTURE) ×2 IMPLANT
SUT VIC AB 0 CT1 18XCR BRD 8 (SUTURE) IMPLANT
SUT VIC AB 0 CT1 27 (SUTURE)
SUT VIC AB 0 CT1 27XBRD ANBCTR (SUTURE) IMPLANT
SUT VIC AB 0 CT1 8-18 (SUTURE)
SUT VIC AB 1 CT1 18XCR BRD 8 (SUTURE) ×1 IMPLANT
SUT VIC AB 1 CT1 8-18 (SUTURE) ×1
SUT VIC AB 2-0 CT2 18 VCP726D (SUTURE) ×2 IMPLANT
SYR 20CC LL (SYRINGE) IMPLANT
SYR BULB IRRIGATION 50ML (SYRINGE) ×2 IMPLANT
SYR CONTROL 10ML LL (SYRINGE) ×4 IMPLANT
SYR TB 1ML 26GX3/8 SAFETY (SYRINGE) ×4 IMPLANT
SYR TB 1ML LUER SLIP (SYRINGE) ×4 IMPLANT
TAPE CLOTH SURG 4X10 WHT LF (GAUZE/BANDAGES/DRESSINGS) ×2 IMPLANT
TOWEL OR 17X24 6PK STRL BLUE (TOWEL DISPOSABLE) ×2 IMPLANT
TOWEL OR 17X26 10 PK STRL BLUE (TOWEL DISPOSABLE) ×2 IMPLANT
WATER STERILE IRR 1000ML POUR (IV SOLUTION) ×2 IMPLANT
YANKAUER SUCT BULB TIP NO VENT (SUCTIONS) ×2 IMPLANT

## 2012-06-21 NOTE — Anesthesia Preprocedure Evaluation (Addendum)
Anesthesia Evaluation  Patient identified by MRN, date of birth, ID band Patient awake    Reviewed: Allergy & Precautions, H&P , NPO status , Patient's Chart, lab work & pertinent test results  Airway       Dental   Pulmonary          Cardiovascular     Neuro/Psych  Headaches,    GI/Hepatic   Endo/Other    Renal/GU      Musculoskeletal   Abdominal   Peds  Hematology   Anesthesia Other Findings   Reproductive/Obstetrics                           Anesthesia Physical Anesthesia Plan  ASA: I  Anesthesia Plan: General   Post-op Pain Management:    Induction: Intravenous  Airway Management Planned: Oral ETT  Additional Equipment:   Intra-op Plan:   Post-operative Plan: Extubation in OR  Informed Consent: I have reviewed the patients History and Physical, chart, labs and discussed the procedure including the risks, benefits and alternatives for the proposed anesthesia with the patient or authorized representative who has indicated his/her understanding and acceptance.     Plan Discussed with: CRNA, Anesthesiologist and Surgeon  Anesthesia Plan Comments:         Anesthesia Quick Evaluation  

## 2012-06-21 NOTE — Transfer of Care (Signed)
Immediate Anesthesia Transfer of Care Note  Patient: Holly Blanchard  Procedure(s) Performed: Procedure(s) with comments: LUMBAR LAMINECTOMY/DECOMPRESSION MICRODISCECTOMY (Left) - Left sided lumbar 5-sacrum 1 microdisectomy  Patient Location: PACU  Anesthesia Type:General  Level of Consciousness: awake, alert , oriented and sedated  Airway & Oxygen Therapy: Patient Spontanous Breathing and Patient connected to nasal cannula oxygen  Post-op Assessment: Report given to PACU RN, Post -op Vital signs reviewed and stable and Patient moving all extremities  Post vital signs: Reviewed and stable  Complications: No apparent anesthesia complications

## 2012-06-21 NOTE — H&P (Signed)
PREOPERATIVE H&P  Chief Complaint: left leg pain  HPI: Holly Blanchard is a 67 y.o. female who presents with ongoing left leg pain  Past Medical History  Diagnosis Date  . Osteopenia   . Migraines     history of, last one 2008  . Cancer     basal cell- left leg removed   Past Surgical History  Procedure Laterality Date  . Neck surgery  1982    right side- lymphnode removed  . Breast surgery Left     biospy   History   Social History  . Marital Status: Married    Spouse Name: N/A    Number of Children: N/A  . Years of Education: N/A   Social History Main Topics  . Smoking status: Never Smoker   . Smokeless tobacco: Never Used  . Alcohol Use: 4.2 oz/week    7 Glasses of wine per week     Comment: wine  . Drug Use: No  . Sexually Active: No   Other Topics Concern  . None   Social History Narrative  . None   Family History  Problem Relation Age of Onset  . Hypertension Mother   . Breast cancer Mother 40    lump removed only  . Cancer Maternal Aunt     colon   Allergies  Allergen Reactions  . Biaxin (Clarithromycin)     REACTION: rash  . Fish Oil     REACTION: itching  . Raloxifene     REACTION: leg cramps   Prior to Admission medications   Medication Sig Start Date End Date Taking? Authorizing Provider  acetaminophen (TYLENOL) 325 MG tablet Take 650 mg by mouth every 6 (six) hours as needed for pain.   Yes Historical Provider, MD  calcium carbonate (OS-CAL) 600 MG TABS Take 600 mg by mouth 2 (two) times daily with a meal.   Yes Historical Provider, MD  cholecalciferol (VITAMIN D) 1000 UNITS tablet Take 1,000 Units by mouth daily.   Yes Historical Provider, MD  Estradiol (VAGIFEM) 10 MCG TABS Place 1 tablet (10 mcg total) vaginally 2 (two) times a week. 10/18/11  Yes Ok Edwards, MD  HYDROcodone-acetaminophen (NORCO/VICODIN) 5-325 MG per tablet Take 1 tablet by mouth every 6 (six) hours as needed for pain.   Yes Historical Provider, MD  OVER THE COUNTER  MEDICATION Apply 1 drop to eye 2 (two) times daily as needed. For dry eyes. Hydro Tears   Yes Historical Provider, MD  SUMAtriptan (IMITREX) 25 MG tablet Take 25 mg by mouth every 2 (two) hours as needed for migraine.     Historical Provider, MD     All other systems have been reviewed and were otherwise negative with the exception of those mentioned in the HPI and as above.  Physical Exam: Filed Vitals:   06/21/12 0637  BP: 136/65  Pulse: 70  Temp: 97.1 F (36.2 C)  Resp: 20    General: Alert, no acute distress Cardiovascular: No pedal edema Respiratory: No cyanosis, no use of accessory musculature Skin: No lesions in the area of chief complaint Neurologic: numbness in S1 distribution, PF weakness Psychiatric: Patient is competent for consent with normal mood and affect Lymphatic: No axillary or cervical lymphadenopathy  MUSCULOSKELETAL: + SLR on left  Assessment/Plan: Left leg pain Plan for Procedure(s): LUMBAR LAMINECTOMY/DECOMPRESSION MICRODISCECTOMY, L5/S1   Emilee Hero, MD 06/21/2012 8:13 AM

## 2012-06-21 NOTE — Anesthesia Postprocedure Evaluation (Signed)
  Anesthesia Post-op Note  Patient: Holly Blanchard  Procedure(s) Performed: Procedure(s) with comments: LUMBAR LAMINECTOMY/DECOMPRESSION MICRODISCECTOMY (Left) - Left sided lumbar 5-sacrum 1 microdisectomy  Patient Location: PACU  Anesthesia Type:General  Level of Consciousness: awake, alert , oriented and patient cooperative  Airway and Oxygen Therapy: Patient Spontanous Breathing  Post-op Pain: mild  Post-op Assessment: Post-op Vital signs reviewed, Patient's Cardiovascular Status Stable, Respiratory Function Stable, Patent Airway, No signs of Nausea or vomiting and Pain level controlled  Post-op Vital Signs: stable  Complications: No apparent anesthesia complications

## 2012-06-21 NOTE — Op Note (Signed)
NAME:  Holly Blanchard, Holly Blanchard NO.:  [id number]  MEDICAL RECORD NO.:  [id number]  LOCATION:  [facility]                         FACILITY:  [facility]  PHYSICIAN:  Holly Bamberg, MD      DATE OF BIRTH:  [birth year]  DATE OF PROCEDURE:  06/21/2012 DATE OF DISCHARGE:  06/21/2012                              OPERATIVE REPORT   PREOPERATIVE DIAGNOSIS: 1. Severe left-sided S1 radiculopathy. 2. Very large left-sided L5-S1 disk herniation.  POSTOPERATIVE DIAGNOSIS: 1. Severe left-sided S1 radiculopathy. 2. Very large left-sided L5-S1 disk herniation.  PROCEDURE:  Left-sided L5-S1 laminotomy, partial facetectomy with removal of very large extruded L5-S1 disk fragment.  SURGEON:  Holly Bamberg, MD  ASSISTANT:  Holly Coop, PA-C  ANESTHESIA:  General endotracheal anesthesia.  COMPLICATIONS:  None.  DISPOSITION:  Stable.  ESTIMATED BLOOD LOSS:  Minimal.  INDICATIONS FOR PROCEDURE:  Briefly, Holly Blanchard is a very pleasant 67- year-old female, who did present to me with severe pain in her left leg. She did fail conservative treatment measures.  She was noted to have weakness, ongoing and increasing pain.  MRI did reveal a very large left- sided L5-S1 disk herniation.  Given the patient's ongoing pain, weakness, and failure of conservative care, we did have a discussion regarding going forward with the left-sided L5-S1 microdiskectomy procedure.  The patient did fully understand the risks and limitations of the procedure as outlined in my preoperative note.  OPERATIVE DETAILS:  On June 21, 2012, the patient was brought to surgery and general endotracheal anesthesia was administered.  The patient was placed prone on a flat Jackson bed with a Wilson frame.  Antibiotics were given and a time-out procedure was performed.  The back was prepped and draped in the usual sterile fashion.  I then made a 1 inch posterior incision  overlying the L5-S1 interspace.  A curvilinear incision was  made just off the midline of the fascia overlying the L5-S1 interspace.  The lamina of L5 and S1 were readily identified and subperiosteally exposed. A lateral intraoperative radiograph to confirm the appropriate operative level.  I then used a Kerrison punch to remove the medial and inferior aspect of the L5 lamina.  The ligamentum flavum was identified and taken down.  The traversing S1 nerve was identified and noted to be under excessive tension.  I was able to safely and gently retract the nerve medially.  A very large pedunculated disk fragment was readily noted. This was removed in 3 large fragments.  At the termination of this portion of the procedure, I was easily able to mobilize the nerve both medially and laterally.  At this point, I did control epidural bleeding using Gelfoam and thrombin.  Bone wax was placed over the region of the laminotomy in order to minimize bleeding.  I then infiltrated 40 mg of Depo-Medrol about the epidural space.  The fascia was then closed using #1 Vicryl.  The subcutaneous layer was closed using 2-0 Vicryl and skin was closed using 3-0 Monocryl.  Benzoin and Steri-Strips were applied followed by sterile dressing.  All instrument counts were correct at the termination of the procedure.  Of note, Holly Blanchard was my  assistant throughout the entirety of the procedure and did aid in essential retraction and suctioning required throughout the surgery.     Holly Bamberg, MD     MD/MEDQ  D:  06/21/2012  T:  06/21/2012  Job:  [id number]  cc:   Dr. Clelia Blanchard

## 2012-06-21 NOTE — Preoperative (Signed)
Beta Blockers   Reason not to administer Beta Blockers:Not Applicable 

## 2012-06-22 ENCOUNTER — Encounter (HOSPITAL_COMMUNITY): Payer: Self-pay | Admitting: Orthopedic Surgery

## 2012-09-07 DIAGNOSIS — T148 Other injury of unspecified body region: Secondary | ICD-10-CM | POA: Diagnosis not present

## 2012-09-07 DIAGNOSIS — L821 Other seborrheic keratosis: Secondary | ICD-10-CM | POA: Diagnosis not present

## 2012-09-07 DIAGNOSIS — D1801 Hemangioma of skin and subcutaneous tissue: Secondary | ICD-10-CM | POA: Diagnosis not present

## 2012-09-21 DIAGNOSIS — IMO0002 Reserved for concepts with insufficient information to code with codable children: Secondary | ICD-10-CM | POA: Diagnosis not present

## 2012-09-21 DIAGNOSIS — Z1331 Encounter for screening for depression: Secondary | ICD-10-CM | POA: Diagnosis not present

## 2012-09-21 DIAGNOSIS — R030 Elevated blood-pressure reading, without diagnosis of hypertension: Secondary | ICD-10-CM | POA: Diagnosis not present

## 2012-09-21 DIAGNOSIS — M899 Disorder of bone, unspecified: Secondary | ICD-10-CM | POA: Diagnosis not present

## 2012-09-21 DIAGNOSIS — Z0000 Encounter for general adult medical examination without abnormal findings: Secondary | ICD-10-CM | POA: Diagnosis not present

## 2012-09-21 DIAGNOSIS — E781 Pure hyperglyceridemia: Secondary | ICD-10-CM | POA: Diagnosis not present

## 2012-09-21 DIAGNOSIS — Z23 Encounter for immunization: Secondary | ICD-10-CM | POA: Diagnosis not present

## 2012-11-14 DIAGNOSIS — IMO0002 Reserved for concepts with insufficient information to code with codable children: Secondary | ICD-10-CM | POA: Diagnosis not present

## 2012-11-22 ENCOUNTER — Other Ambulatory Visit: Payer: Self-pay

## 2012-11-22 DIAGNOSIS — Z1231 Encounter for screening mammogram for malignant neoplasm of breast: Secondary | ICD-10-CM

## 2012-12-12 ENCOUNTER — Ambulatory Visit (INDEPENDENT_AMBULATORY_CARE_PROVIDER_SITE_OTHER): Payer: Medicare Other | Admitting: Gynecology

## 2012-12-12 ENCOUNTER — Encounter: Payer: Self-pay | Admitting: Gynecology

## 2012-12-12 VITALS — BP 132/74 | Ht 64.0 in | Wt 132.0 lb

## 2012-12-12 DIAGNOSIS — N952 Postmenopausal atrophic vaginitis: Secondary | ICD-10-CM

## 2012-12-12 DIAGNOSIS — Z8639 Personal history of other endocrine, nutritional and metabolic disease: Secondary | ICD-10-CM | POA: Diagnosis not present

## 2012-12-12 DIAGNOSIS — M899 Disorder of bone, unspecified: Secondary | ICD-10-CM

## 2012-12-12 DIAGNOSIS — M8580 Other specified disorders of bone density and structure, unspecified site: Secondary | ICD-10-CM

## 2012-12-12 MED ORDER — NONFORMULARY OR COMPOUNDED ITEM: Status: DC

## 2012-12-12 NOTE — Patient Instructions (Signed)
Shingles Vaccine  What You Need to Know  WHAT IS SHINGLES?  · Shingles is a painful skin rash, often with blisters. It is also called Herpes Zoster or just Zoster.  · A shingles rash usually appears on one side of the face or body and lasts from 2 to 4 weeks. Its main symptom is pain, which can be quite severe. Other symptoms of shingles can include fever, headache, chills, and upset stomach. Very rarely, a shingles infection can lead to pneumonia, hearing problems, blindness, brain inflammation (encephalitis), or death.  · For about 1 person in 5, severe pain can continue even after the rash clears up. This is called post-herpetic neuralgia.  · Shingles is caused by the Varicella Zoster virus. This is the same virus that causes chickenpox. Only someone who has had a case of chickenpox or rarely, has gotten chickenpox vaccine, can get shingles. The virus stays in your body. It can reappear many years later to cause a case of shingles.  · You cannot catch shingles from another person with shingles. However, a person who has never had chickenpox (or chickenpox vaccine) could get chickenpox from someone with shingles. This is not very common.  · Shingles is far more common in people 50 and older than in younger people. It is also more common in people whose immune systems are weakened because of a disease such as cancer or drugs such as steroids or chemotherapy.  · At least 1 million people get shingles per year in the [place].  SHINGLES VACCINE  · A vaccine for shingles was licensed in 2006. In clinical trials, the vaccine reduced the risk of shingles by 50%. It can also reduce the pain in people who still get shingles after being vaccinated.  · A single dose of shingles vaccine is recommended for adults 60 years of age and older.  SOME PEOPLE SHOULD NOT GET SHINGLES VACCINE OR SHOULD WAIT  A person should not get shingles vaccine if he or she:  · Has ever had a life-threatening allergic reaction to gelatin, the  antibiotic neomycin, or any other component of shingles vaccine. Tell your caregiver if you have any severe allergies.  · Has a weakened immune system because of current:  · AIDS or another disease that affects the immune system.  · Treatment with drugs that affect the immune system, such as prolonged use of high-dose steroids.  · Cancer treatment, such as radiation or chemotherapy.  · Cancer affecting the bone marrow or lymphatic system, such as leukemia or lymphoma.  · Is pregnant, or might be pregnant. Women should not become pregnant until at least 4 weeks after getting shingles vaccine.  Someone with a minor illness, such as a cold, may be vaccinated. Anyone with a moderate or severe acute illness should usually wait until he or she recovers before getting the vaccine. This includes anyone with a temperature of 101.3° F (38° C) or higher.  WHAT ARE THE RISKS FROM SHINGLES VACCINE?  · A vaccine, like any medicine, could possibly cause serious problems, such as severe allergic reactions. However, the risk of a vaccine causing serious harm, or death, is extremely small.  · No serious problems have been identified with shingles vaccine.  Mild Problems  · Redness, soreness, swelling, or itching at the site of the injection (about 1 person in 3).  · Headache (about 1 person in 70).  Like all vaccines, shingles vaccine is being closely monitored for unusual or severe problems.  WHAT IF   THERE IS A MODERATE OR SEVERE REACTION?  What should I look for?  Any unusual condition, such as a severe allergic reaction or a high fever. If a severe allergic reaction occurred, it would be within a few minutes to an hour after the shot. Signs of a serious allergic reaction can include difficulty breathing, weakness, hoarseness or wheezing, a fast heartbeat, hives, dizziness, paleness, or swelling of the throat.  What should I do?  · Call your caregiver, or get the person to a caregiver right away.  · Tell the caregiver what  happened, the date and time it happened, and when the vaccination was given.  · Ask the caregiver to report the reaction by filing a Vaccine Adverse Event Reporting System (VAERS) form. Or, you can file this report through the VAERS web site at www.vaers.hhs.gov or by calling 1-800-822-7967.  VAERS does not provide medical advice.  HOW CAN I LEARN MORE?  · Ask your caregiver. He or she can give you the vaccine package insert or suggest other sources of information.  · Contact the Centers for Disease Control and Prevention (CDC):  · Call 1-800-232-4636 (1-800-CDC-INFO).  · Visit the CDC website at www.cdc.gov/vaccines  CDC Shingles Vaccine VIS (10/10/07)  Document Released: 10/18/2005 Document Revised: 03/15/2011 Document Reviewed: 04/12/2012  [employer]® Patient Information ©2014 [employer].

## 2012-12-12 NOTE — Progress Notes (Signed)
Holly Blanchard 04-Apr-1945 [id number]   History:    67 y.o.  for GYN followup. Patient does suffer from vaginal atrophy and she had been placed on Vagifem 10 mcg twice a week which has helped her tremendously but she would like to use some other brand that iscost-effective. Patient had disc surgery this year and has done well. She has had history in the past and vitamin D deficiency. Her new PCP is Dr. Buren Kos who has been doing all her lab work. Patient had colonoscopy in 2011 and does have a past history of colon polyps which were benign in 2005. Patient's last bone density study was in 2012 with the lowest T score of the right femoral neck with a value of -1.6 and had normal FRAX Amalysis. She is currently taking her calcium and vitamin D twice a day. Patient denies any prior history of abnormal Pap smear. She has not received the shingles vaccine yet and did get shingles last year. Past medical history,surgical history, family history and social history were all reviewed and documented in the EPIC chart.  Gynecologic History No LMP recorded. Patient is postmenopausal. Contraception: post menopausal status Last Pap: 2013. Results were: normal Last mammogram: 2014. Results were: normal but dense 3-D mammogram utilized  Obstetric History OB History  Gravida Para Term Preterm AB SAB TAB Ectopic Multiple Living  2 1   1 1    1     # Outcome Date GA Lbr Len/2nd Weight Sex Delivery Anes PTL Lv  2 SAB           1 PAR                ROS: A ROS was performed and pertinent positives and negatives are included in the history.  GENERAL: No fevers or chills. HEENT: No change in vision, no earache, sore throat or sinus congestion. NECK: No pain or stiffness. CARDIOVASCULAR: No chest pain or pressure. No palpitations. PULMONARY: No shortness of breath, cough or wheeze. GASTROINTESTINAL: No abdominal pain, nausea, vomiting or diarrhea, melena or bright red blood per rectum. GENITOURINARY: No urinary  frequency, urgency, hesitancy or dysuria. MUSCULOSKELETAL: No joint or muscle pain, no back pain, no recent trauma. DERMATOLOGIC: No rash, no itching, no lesions. ENDOCRINE: No polyuria, polydipsia, no heat or cold intolerance. No recent change in weight. HEMATOLOGICAL: No anemia or easy bruising or bleeding. NEUROLOGIC: No headache, seizures, numbness, tingling or weakness. PSYCHIATRIC: No depression, no loss of interest in normal activity or change in sleep pattern.     Exam: chaperone present  BP 132/74  Ht 5\' 4"  (1.626 m)  Wt 132 lb (59.875 kg)  BMI 22.65 kg/m2  Body mass index is 22.65 kg/(m^2).  General appearance : Well developed well nourished female. No acute distress HEENT: Neck supple, trachea midline, no carotid bruits, no thyroidmegaly Lungs: Clear to auscultation, no rhonchi or wheezes, or rib retractions  Heart: Regular rate and rhythm, no murmurs or gallops Breast:Examined in sitting and supine position were symmetrical in appearance, no palpable masses or tenderness,  no skin retraction, no nipple inversion, no nipple discharge, no skin discoloration, no axillary or supraclavicular lymphadenopathy Abdomen: no palpable masses or tenderness, no rebound or guarding Extremities: no edema or skin discoloration or tenderness  Pelvic:  Bartholin, Urethra, Skene Glands: Within normal limits             Vagina: No gross lesions or discharge, atrophic changes  Cervix: No gross lesions or discharge  Uterus  axial,  normal size, shape and consistency, non-tender and mobile  Adnexa  Without masses or tenderness  Anus and perineum  normal   Rectovaginal  normal sphincter tone without palpated masses or tenderness             Hemoccult PCP provides     Assessment/Plan:  67 y.o. female with history of vaginal atrophy will be switched to vaginal estradiol 0.02% to apply twice a week. We'll check her vitamin D level because of her past history vitamin D deficiency and history of  osteopenia. She will be scheduled here in the next few weeks for her bone density study. She was reminded to take her calcium and vitamin D and participate in regular weight bearing exercises twice a week for osteoporosis prevention. Prescription for shingles vaccine provided. Patient's Pneumovax and flu vaccine up-to-date. Pap smear was not done today in accordance with a new guidelines.  Note: This dictation was prepared with  Dragon/digital dictation along withSmart phrase technology. Any transcriptional errors that result from this process are unintentional.   Ok Edwards MD, 12:31 PM 12/12/2012

## 2012-12-13 LAB — VITAMIN D 25 HYDROXY (VIT D DEFICIENCY, FRACTURES): Vit D, 25-Hydroxy: 36 ng/mL (ref 30–89)

## 2012-12-14 DIAGNOSIS — H2510 Age-related nuclear cataract, unspecified eye: Secondary | ICD-10-CM | POA: Diagnosis not present

## 2012-12-14 DIAGNOSIS — H43399 Other vitreous opacities, unspecified eye: Secondary | ICD-10-CM | POA: Diagnosis not present

## 2012-12-14 DIAGNOSIS — H4011X0 Primary open-angle glaucoma, stage unspecified: Secondary | ICD-10-CM | POA: Diagnosis not present

## 2012-12-25 ENCOUNTER — Ambulatory Visit: Payer: Medicare Other

## 2012-12-26 ENCOUNTER — Ambulatory Visit
Admission: RE | Admit: 2012-12-26 | Discharge: 2012-12-26 | Disposition: A | Discharge status: Home / Self Care | Payer: Medicare Other | Source: Ambulatory Visit

## 2012-12-26 DIAGNOSIS — Z1231 Encounter for screening mammogram for malignant neoplasm of breast: Secondary | ICD-10-CM | POA: Diagnosis not present

## 2013-02-13 ENCOUNTER — Ambulatory Visit (INDEPENDENT_AMBULATORY_CARE_PROVIDER_SITE_OTHER): Payer: Medicare Other

## 2013-02-13 DIAGNOSIS — Z8639 Personal history of other endocrine, nutritional and metabolic disease: Secondary | ICD-10-CM | POA: Diagnosis not present

## 2013-02-13 DIAGNOSIS — M8580 Other specified disorders of bone density and structure, unspecified site: Secondary | ICD-10-CM

## 2013-02-13 DIAGNOSIS — M949 Disorder of cartilage, unspecified: Secondary | ICD-10-CM | POA: Diagnosis not present

## 2013-02-13 DIAGNOSIS — M899 Disorder of bone, unspecified: Secondary | ICD-10-CM | POA: Diagnosis not present

## 2013-02-15 ENCOUNTER — Other Ambulatory Visit: Payer: Medicare Other

## 2013-02-15 ENCOUNTER — Other Ambulatory Visit: Payer: Self-pay | Admitting: *Deleted

## 2013-02-15 DIAGNOSIS — M898X9 Other specified disorders of bone, unspecified site: Secondary | ICD-10-CM

## 2013-02-15 DIAGNOSIS — M948X9 Other specified disorders of cartilage, unspecified sites: Secondary | ICD-10-CM | POA: Diagnosis not present

## 2013-02-15 DIAGNOSIS — M8580 Other specified disorders of bone density and structure, unspecified site: Secondary | ICD-10-CM

## 2013-02-15 DIAGNOSIS — M899 Disorder of bone, unspecified: Secondary | ICD-10-CM | POA: Diagnosis not present

## 2013-02-16 LAB — PTH, INTACT AND CALCIUM
CALCIUM: 9.3 mg/dL (ref 8.4–10.5)
PTH: 39.4 pg/mL (ref 14.0–72.0)

## 2013-03-05 ENCOUNTER — Telehealth: Payer: Self-pay | Admitting: *Deleted

## 2013-03-05 NOTE — Telephone Encounter (Signed)
Pt informed with results on 02/15/13.

## 2013-09-14 DIAGNOSIS — R82998 Other abnormal findings in urine: Secondary | ICD-10-CM | POA: Diagnosis not present

## 2013-09-14 DIAGNOSIS — E785 Hyperlipidemia, unspecified: Secondary | ICD-10-CM | POA: Diagnosis not present

## 2013-09-14 DIAGNOSIS — E781 Pure hyperglyceridemia: Secondary | ICD-10-CM | POA: Diagnosis not present

## 2013-09-17 ENCOUNTER — Other Ambulatory Visit: Payer: Self-pay | Admitting: Dermatology

## 2013-09-17 DIAGNOSIS — L570 Actinic keratosis: Secondary | ICD-10-CM | POA: Diagnosis not present

## 2013-09-17 DIAGNOSIS — D485 Neoplasm of uncertain behavior of skin: Secondary | ICD-10-CM | POA: Diagnosis not present

## 2013-09-26 ENCOUNTER — Other Ambulatory Visit: Payer: Self-pay | Admitting: Internal Medicine

## 2013-09-26 DIAGNOSIS — E785 Hyperlipidemia, unspecified: Secondary | ICD-10-CM | POA: Diagnosis not present

## 2013-09-26 DIAGNOSIS — M949 Disorder of cartilage, unspecified: Secondary | ICD-10-CM | POA: Diagnosis not present

## 2013-09-26 DIAGNOSIS — Z23 Encounter for immunization: Secondary | ICD-10-CM | POA: Diagnosis not present

## 2013-09-26 DIAGNOSIS — R109 Unspecified abdominal pain: Secondary | ICD-10-CM | POA: Diagnosis not present

## 2013-09-26 DIAGNOSIS — Z0000 Encounter for general adult medical examination without abnormal findings: Secondary | ICD-10-CM | POA: Diagnosis not present

## 2013-09-26 DIAGNOSIS — M899 Disorder of bone, unspecified: Secondary | ICD-10-CM | POA: Diagnosis not present

## 2013-09-26 DIAGNOSIS — M25552 Pain in left hip: Secondary | ICD-10-CM

## 2013-09-26 DIAGNOSIS — IMO0002 Reserved for concepts with insufficient information to code with codable children: Secondary | ICD-10-CM | POA: Diagnosis not present

## 2013-09-26 DIAGNOSIS — R030 Elevated blood-pressure reading, without diagnosis of hypertension: Secondary | ICD-10-CM | POA: Diagnosis not present

## 2013-09-27 ENCOUNTER — Ambulatory Visit
Admission: RE | Admit: 2013-09-27 | Discharge: 2013-09-27 | Disposition: A | Discharge status: Home / Self Care | Payer: Medicare Other | Source: Ambulatory Visit | Attending: Internal Medicine | Admitting: Internal Medicine

## 2013-09-27 ENCOUNTER — Encounter (INDEPENDENT_AMBULATORY_CARE_PROVIDER_SITE_OTHER): Payer: Self-pay

## 2013-09-27 DIAGNOSIS — M25552 Pain in left hip: Secondary | ICD-10-CM

## 2013-09-27 DIAGNOSIS — R1032 Left lower quadrant pain: Secondary | ICD-10-CM | POA: Diagnosis not present

## 2013-09-27 DIAGNOSIS — R10814 Left lower quadrant abdominal tenderness: Secondary | ICD-10-CM | POA: Diagnosis not present

## 2013-09-27 DIAGNOSIS — Z1212 Encounter for screening for malignant neoplasm of rectum: Secondary | ICD-10-CM | POA: Diagnosis not present

## 2013-11-05 ENCOUNTER — Encounter: Payer: Self-pay | Admitting: Gynecology

## 2013-11-08 ENCOUNTER — Other Ambulatory Visit: Payer: Self-pay | Admitting: Internal Medicine

## 2013-11-08 DIAGNOSIS — R110 Nausea: Secondary | ICD-10-CM

## 2013-11-08 DIAGNOSIS — Y92009 Unspecified place in unspecified non-institutional (private) residence as the place of occurrence of the external cause: Secondary | ICD-10-CM

## 2013-11-08 DIAGNOSIS — S0003XA Contusion of scalp, initial encounter: Secondary | ICD-10-CM | POA: Diagnosis not present

## 2013-11-08 DIAGNOSIS — W19XXXA Unspecified fall, initial encounter: Secondary | ICD-10-CM | POA: Diagnosis not present

## 2013-11-08 DIAGNOSIS — Z6822 Body mass index (BMI) 22.0-22.9, adult: Secondary | ICD-10-CM | POA: Diagnosis not present

## 2013-11-08 DIAGNOSIS — R519 Headache, unspecified: Secondary | ICD-10-CM

## 2013-11-08 DIAGNOSIS — R51 Headache: Secondary | ICD-10-CM

## 2013-11-08 DIAGNOSIS — S060X0A Concussion without loss of consciousness, initial encounter: Secondary | ICD-10-CM

## 2013-11-08 DIAGNOSIS — S8010XA Contusion of unspecified lower leg, initial encounter: Secondary | ICD-10-CM | POA: Diagnosis not present

## 2013-11-09 ENCOUNTER — Other Ambulatory Visit: Payer: Medicare Other

## 2013-11-09 ENCOUNTER — Other Ambulatory Visit: Payer: Self-pay | Admitting: Internal Medicine

## 2013-11-09 ENCOUNTER — Ambulatory Visit
Admission: RE | Admit: 2013-11-09 | Discharge: 2013-11-09 | Disposition: A | Discharge status: Home / Self Care | Payer: Medicare Other | Source: Ambulatory Visit | Attending: Internal Medicine | Admitting: Internal Medicine

## 2013-11-09 DIAGNOSIS — R42 Dizziness and giddiness: Secondary | ICD-10-CM | POA: Diagnosis not present

## 2013-11-09 DIAGNOSIS — R51 Headache: Secondary | ICD-10-CM | POA: Diagnosis not present

## 2013-11-09 DIAGNOSIS — Z7189 Other specified counseling: Secondary | ICD-10-CM

## 2013-11-09 DIAGNOSIS — S0990XA Unspecified injury of head, initial encounter: Secondary | ICD-10-CM | POA: Diagnosis not present

## 2013-12-14 ENCOUNTER — Other Ambulatory Visit: Payer: Self-pay

## 2013-12-14 DIAGNOSIS — Z1231 Encounter for screening mammogram for malignant neoplasm of breast: Secondary | ICD-10-CM

## 2014-01-03 ENCOUNTER — Ambulatory Visit
Admission: RE | Admit: 2014-01-03 | Discharge: 2014-01-03 | Disposition: A | Discharge status: Home / Self Care | Payer: Medicare Other | Source: Ambulatory Visit

## 2014-01-03 DIAGNOSIS — Z1231 Encounter for screening mammogram for malignant neoplasm of breast: Secondary | ICD-10-CM | POA: Diagnosis not present

## 2014-01-18 ENCOUNTER — Encounter: Payer: Medicare Other | Admitting: Gynecology

## 2014-02-11 ENCOUNTER — Encounter: Payer: Self-pay | Admitting: Gynecology

## 2014-02-11 ENCOUNTER — Ambulatory Visit (INDEPENDENT_AMBULATORY_CARE_PROVIDER_SITE_OTHER): Payer: Medicare Other | Admitting: Gynecology

## 2014-02-11 VITALS — BP 132/88 | Ht 64.0 in | Wt 132.4 lb

## 2014-02-11 DIAGNOSIS — Z8639 Personal history of other endocrine, nutritional and metabolic disease: Secondary | ICD-10-CM | POA: Diagnosis not present

## 2014-02-11 DIAGNOSIS — Z01419 Encounter for gynecological examination (general) (routine) without abnormal findings: Secondary | ICD-10-CM | POA: Diagnosis not present

## 2014-02-11 DIAGNOSIS — N952 Postmenopausal atrophic vaginitis: Secondary | ICD-10-CM | POA: Diagnosis not present

## 2014-02-11 DIAGNOSIS — M8580 Other specified disorders of bone density and structure, unspecified site: Secondary | ICD-10-CM

## 2014-02-11 MED ORDER — ESTRADIOL 10 MCG VA TABS: 1.0000 | ORAL_TABLET | VAGINAL | Status: DC

## 2014-02-11 NOTE — Progress Notes (Signed)
Holly Blanchard [birth year] [id number]   History:    69 y.o.  for annual gyn exam who is only complaint is of vaginal atrophy. She had used Vagifem before intravaginally twice a week but became too expensive and she used it estradiol from the compounding pharmacy twice a week which is help with her vaginal dryness although it is somewhat messy for her.  She has had history in the past and vitamin D deficiency. Her new PCP is Dr. Lutricia Feil who has been doing all her lab work. Patient had colonoscopy in 2011 and does have a past history of colon polyps which were benign in 2005. Patient's last bone density study was in   Her bone density study done here in 2015 demonstrated that her lowest T score was at the right femoral neck with a value of -1.9. She has statistically significant decrease in bone mineralization of the AP spine of -4.7% and of the right femoral neck -5%. She had statistically significant increase on her left femoral neck of 9%. She had normal Frax analysis and last year her calcium, vitamin D and PTH within the normal range. Her primary care physician has been doing her blood work.  Patient needs her shingles vaccine as well as the Pneumovax booster which she's going to give from her primary care physician. She did receive the flu vaccine this year. She had a colonoscopy in 2011 but does have past history of benign colon polyps having been removed. Patient denies any past history of any abnormal Pap smears.  Past medical history,surgical history, family history and social history were all reviewed and documented in the EPIC chart.  Gynecologic History No LMP recorded. Patient is postmenopausal. Contraception: post menopausal status Last Pap: 2012. Results were: normal Last mammogram: 2015. Results were: Normal but dense she has been getting three-dimensional mammograms  Obstetric History OB History  Gravida Para Term Preterm AB SAB TAB Ectopic Multiple Living  2 1   1 1    1     # Outcome Date GA Lbr Len/2nd Weight Sex Delivery Anes PTL Lv  2 SAB           1 Para                ROS: A ROS was performed and pertinent positives and negatives are included in the history.  GENERAL: No fevers or chills. HEENT: No change in vision, no earache, sore throat or sinus congestion. NECK: No pain or stiffness. CARDIOVASCULAR: No chest pain or pressure. No palpitations. PULMONARY: No shortness of breath, cough or wheeze. GASTROINTESTINAL: No abdominal pain, nausea, vomiting or diarrhea, melena or bright red blood per rectum. GENITOURINARY: No urinary frequency, urgency, hesitancy or dysuria. MUSCULOSKELETAL: No joint or muscle pain, no back pain, no recent trauma. DERMATOLOGIC: No rash, no itching, no lesions. ENDOCRINE: No polyuria, polydipsia, no heat or cold intolerance. No recent change in weight. HEMATOLOGICAL: No anemia or easy bruising or bleeding. NEUROLOGIC: No headache, seizures, numbness, tingling or weakness. PSYCHIATRIC: No depression, no loss of interest in normal activity or change in sleep pattern.     Exam: chaperone present  BP 132/88 mmHg  Ht 5\' 4"  (1.626 m)  Wt 132 lb 6.4 oz (60.056 kg)  BMI 22.72 kg/m2  Body mass index is 22.72 kg/(m^2).  General appearance : Well developed well nourished female. No acute distress HEENT: Neck supple, trachea midline, no carotid bruits, no thyroidmegaly Lungs: Clear to auscultation, no rhonchi or wheezes, or rib retractions  Heart: Regular rate and rhythm, no murmurs or gallops Breast:Examined in sitting and supine position were symmetrical in appearance, no palpable masses or tenderness,  no skin retraction, no nipple inversion, no nipple discharge, no skin discoloration, no axillary or supraclavicular lymphadenopathy Abdomen: no palpable masses or tenderness, no rebound or guarding Extremities: no edema or skin discoloration or tenderness  Pelvic:  Bartholin, Urethra, Skene Glands: Within normal limits              Vagina: No gross lesions or discharge, atrophic changes  Cervix: No gross lesions or discharge  Uterus  aaxialxial, normal size, shape and consistency, non-tender and mobile  Adnexa  Without masses or tenderness  Anus and perineum  normal   Rectovaginal  normal sphincter tone without palpated masses or tenderness             Hemoccult cards will be provided by her PCP    Assessment/Plan:  69 y.o. female for annual exam who was reminded to contact her PCP to schedule her colonoscopy which is due. We are going to check her vitamin D level. We discussed importance of calcium vitamin D and regular exercise for osteoporosis prevention. Next year she will need a bone density study. Pap smear is no longer needed. We discussed importance of monthly breast exams. Prescription refill for Vagifem 10 g to apply intravaginally twice a week for vaginal atrophy was provided.   Terrance Mass MD, 2:09 PM 02/11/2014

## 2014-02-11 NOTE — Patient Instructions (Signed)
Estradiol vaginal tablets What is this medicine? ESTRADIOL (es tra DYE ole) vaginal tablet is used to help relieve symptoms of vaginal irritation and dryness that occurs in some women during menopause. This medicine may be used for other purposes; ask your health care provider or pharmacist if you have questions. COMMON BRAND NAME(S): Vagifem What should I tell my health care provider before I take this medicine? They need to know if you have any of these conditions: -abnormal vaginal bleeding -blood vessel disease or blood clots -breast, cervical, endometrial, ovarian, liver, or uterine cancer -dementia -diabetes -gallbladder disease -heart disease or recent heart attack -high blood pressure -high cholesterol -high level of calcium in the blood -hysterectomy -kidney disease -liver disease -migraine headaches -protein C deficiency -protein S deficiency -stroke -systemic lupus erythematosus (SLE) -tobacco smoker -an unusual or allergic reaction to estrogens, other hormones, medicines, foods, dyes, or preservatives -pregnant or trying to get pregnant -breast-feeding How should I use this medicine? This medicine is only for use in the vagina. Do not take by mouth. Wash your hands before and after use. Read package directions carefully. Unwrap the pre-filled applicator package. Lie on your back, part and bend your knees. Gently insert the applicator tip high in the vagina and push the plunger to release the tablet into the vagina. Gently remove the applicator. Throw away the applicator after use. Do not use your medicine more often than directed. Finish the full course prescribed by your doctor or health care professional even if you think your condition is better. Do not stop using except on the advice of your doctor or health care professional. Talk to your pediatrician regarding the use of this medicine in children. A patient package insert for the product will be given with each  prescription and refill. Read this sheet carefully each time. The sheet may change frequently. Overdosage: If you think you have taken too much of this medicine contact a poison control center or emergency room at once. NOTE: This medicine is only for you. Do not share this medicine with others. What if I miss a dose? If you miss a dose, take it as soon as you can. If it is almost time for your next dose, take only that dose. Do not take double or extra doses. What may interact with this medicine? Do not take this medicine with any of the following medications: -aromatase inhibitors like aminoglutethimide, anastrozole, exemestane, letrozole, testolactone This medicine may also interact with the following medications: -antibiotics used to treat tuberculosis like rifabutin, rifampin and rifapentene -raloxifene or tamoxifen -warfarin This list may not describe all possible interactions. Give your health care provider a list of all the medicines, herbs, non-prescription drugs, or dietary supplements you use. Also tell them if you smoke, drink alcohol, or use illegal drugs. Some items may interact with your medicine. What should I watch for while using this medicine? Visit your health care professional for regular checks on your progress. You will need a regular breast and pelvic exam. You should also discuss the need for regular mammograms with your health care professional, and follow his or her guidelines. This medicine can make your body retain fluid, making your fingers, hands, or ankles swell. Your blood pressure can go up. Contact your doctor or health care professional if you feel you are retaining fluid. If you have any reason to think you are pregnant; stop taking this medicine at once and contact your doctor or health care professional. Tobacco smoking increases the risk of getting  a blood clot or having a stroke, especially if you are more than 69 years old. You are strongly advised not to  smoke. If you wear contact lenses and notice visual changes, or if the lenses begin to feel uncomfortable, consult your eye care specialist. If you are going to have elective surgery, you may need to stop taking this medicine beforehand. Consult your health care professional for advice prior to scheduling the surgery. What side effects may I notice from receiving this medicine? Side effects that you should report to your doctor or health care professional as soon as possible: -allergic reactions like skin rash, itching or hives, swelling of the face, lips, or tongue -breast tissue changes or discharge -changes in vision -chest pain -confusion, trouble speaking or understanding -dark urine -general ill feeling or flu-like symptoms -light-colored stools -nausea, vomiting -pain, swelling, warmth in the leg -right upper belly pain -severe headaches -shortness of breath -sudden numbness or weakness of the face, arm or leg -trouble walking, dizziness, loss of balance or coordination -unusual vaginal bleeding -yellowing of the eyes or skin Side effects that usually do not require medical attention (report to your doctor or health care professional if they continue or are bothersome): -hair loss -increased hunger or thirst -increased urination -symptoms of vaginal infection like itching, irritation or unusual discharge -unusually weak or tired This list may not describe all possible side effects. Call your doctor for medical advice about side effects. You may report side effects to FDA at 1-800-FDA-1088. Where should I keep my medicine? Keep out of the reach of children. Store at room temperature between 15 and 30 degrees C (59 and 86 degrees F). Throw away any unused medicine after the expiration date. NOTE: This sheet is a summary. It may not cover all possible information. If you have questions about this medicine, talk to your doctor, pharmacist, or health care provider.  2015,  [employer] Standard. (2010-03-25 09:08:58)

## 2014-02-12 ENCOUNTER — Telehealth: Payer: Self-pay | Admitting: *Deleted

## 2014-02-12 LAB — VITAMIN D 25 HYDROXY (VIT D DEFICIENCY, FRACTURES): Vit D, 25-Hydroxy: 38 ng/mL (ref 30–100)

## 2014-02-12 NOTE — Telephone Encounter (Signed)
Prior authorization for vagifem 10 mcg faxed to [facility], will wait for response.

## 2014-02-13 NOTE — Telephone Encounter (Signed)
Medication approved through 01/04/15

## 2014-03-18 DIAGNOSIS — L812 Freckles: Secondary | ICD-10-CM | POA: Diagnosis not present

## 2014-03-18 DIAGNOSIS — L280 Lichen simplex chronicus: Secondary | ICD-10-CM | POA: Diagnosis not present

## 2014-03-18 DIAGNOSIS — L820 Inflamed seborrheic keratosis: Secondary | ICD-10-CM | POA: Diagnosis not present

## 2014-03-18 DIAGNOSIS — L308 Other specified dermatitis: Secondary | ICD-10-CM | POA: Diagnosis not present

## 2014-03-18 DIAGNOSIS — L570 Actinic keratosis: Secondary | ICD-10-CM | POA: Diagnosis not present

## 2014-09-25 DIAGNOSIS — N390 Urinary tract infection, site not specified: Secondary | ICD-10-CM | POA: Diagnosis not present

## 2014-09-25 DIAGNOSIS — E785 Hyperlipidemia, unspecified: Secondary | ICD-10-CM | POA: Diagnosis not present

## 2014-09-25 DIAGNOSIS — R8290 Unspecified abnormal findings in urine: Secondary | ICD-10-CM | POA: Diagnosis not present

## 2014-09-25 DIAGNOSIS — M859 Disorder of bone density and structure, unspecified: Secondary | ICD-10-CM | POA: Diagnosis not present

## 2014-12-02 DIAGNOSIS — H40003 Preglaucoma, unspecified, bilateral: Secondary | ICD-10-CM | POA: Diagnosis not present

## 2014-12-02 DIAGNOSIS — H25813 Combined forms of age-related cataract, bilateral: Secondary | ICD-10-CM | POA: Diagnosis not present

## 2015-02-05 DIAGNOSIS — E784 Other hyperlipidemia: Secondary | ICD-10-CM | POA: Diagnosis not present

## 2015-02-05 DIAGNOSIS — Z1389 Encounter for screening for other disorder: Secondary | ICD-10-CM | POA: Diagnosis not present

## 2015-02-05 DIAGNOSIS — M859 Disorder of bone density and structure, unspecified: Secondary | ICD-10-CM | POA: Diagnosis not present

## 2015-02-05 DIAGNOSIS — Z6823 Body mass index (BMI) 23.0-23.9, adult: Secondary | ICD-10-CM | POA: Diagnosis not present

## 2015-02-05 DIAGNOSIS — R030 Elevated blood-pressure reading, without diagnosis of hypertension: Secondary | ICD-10-CM | POA: Diagnosis not present

## 2015-02-05 DIAGNOSIS — Z0000 Encounter for general adult medical examination without abnormal findings: Secondary | ICD-10-CM | POA: Diagnosis not present

## 2015-02-05 DIAGNOSIS — Z23 Encounter for immunization: Secondary | ICD-10-CM | POA: Diagnosis not present

## 2015-02-10 DIAGNOSIS — Z1212 Encounter for screening for malignant neoplasm of rectum: Secondary | ICD-10-CM | POA: Diagnosis not present

## 2015-02-11 ENCOUNTER — Other Ambulatory Visit: Payer: Self-pay

## 2015-02-11 DIAGNOSIS — Z1231 Encounter for screening mammogram for malignant neoplasm of breast: Secondary | ICD-10-CM

## 2015-02-13 ENCOUNTER — Encounter: Payer: Medicare Other | Admitting: Gynecology

## 2015-02-27 ENCOUNTER — Ambulatory Visit
Admission: RE | Admit: 2015-02-27 | Discharge: 2015-02-27 | Disposition: A | Discharge status: Home / Self Care | Payer: Medicare Other | Source: Ambulatory Visit

## 2015-02-27 DIAGNOSIS — Z1231 Encounter for screening mammogram for malignant neoplasm of breast: Secondary | ICD-10-CM | POA: Diagnosis not present

## 2015-03-18 DIAGNOSIS — D1801 Hemangioma of skin and subcutaneous tissue: Secondary | ICD-10-CM | POA: Diagnosis not present

## 2015-03-18 DIAGNOSIS — L404 Guttate psoriasis: Secondary | ICD-10-CM | POA: Diagnosis not present

## 2015-03-18 DIAGNOSIS — L812 Freckles: Secondary | ICD-10-CM | POA: Diagnosis not present

## 2015-03-18 DIAGNOSIS — L820 Inflamed seborrheic keratosis: Secondary | ICD-10-CM | POA: Diagnosis not present

## 2015-03-19 ENCOUNTER — Encounter: Payer: Medicare Other | Admitting: Gynecology

## 2015-05-16 ENCOUNTER — Encounter: Payer: Self-pay | Admitting: Gynecology

## 2015-05-16 ENCOUNTER — Ambulatory Visit (INDEPENDENT_AMBULATORY_CARE_PROVIDER_SITE_OTHER): Payer: Medicare Other | Admitting: Gynecology

## 2015-05-16 VITALS — BP 130/86 | Ht 64.0 in | Wt 133.0 lb

## 2015-05-16 DIAGNOSIS — N952 Postmenopausal atrophic vaginitis: Secondary | ICD-10-CM

## 2015-05-16 DIAGNOSIS — Z79890 Hormone replacement therapy (postmenopausal): Secondary | ICD-10-CM

## 2015-05-16 DIAGNOSIS — M8580 Other specified disorders of bone density and structure, unspecified site: Secondary | ICD-10-CM

## 2015-05-16 DIAGNOSIS — Z8639 Personal history of other endocrine, nutritional and metabolic disease: Secondary | ICD-10-CM

## 2015-05-16 DIAGNOSIS — Z01419 Encounter for gynecological examination (general) (routine) without abnormal findings: Secondary | ICD-10-CM | POA: Diagnosis not present

## 2015-05-16 MED ORDER — NONFORMULARY OR COMPOUNDED ITEM: Status: DC

## 2015-05-16 NOTE — Progress Notes (Signed)
Holly Blanchard June 12, 1945 [id number]   History:    70 y.o.  for annual gyn exam with no complaints today. Patient had been on Vagifem 10 g intravaginally twice a week for vaginal atrophy would like to switch to a less expensive vaginal estrogen. 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 Her new PCP is Dr. Lutricia Blanchard who has been doing all her lab work. Patient had colonoscopy in demonstrated that her lowest T score was at the right femoral neck with a value of -1.9. She has statistically significant decrease in bone mineralization of the AP spine of -4.7% and of the right femoral neck -5%. She had statistically significant increase on her left femoral neck of 9%. She had normal Frax analysis and last year her calcium, vitamin D and PTH within the normal range. Her primary care physician has been doing her blood work. 2011 and does have a past history of colon polyps which were benign in 2005. Patient's last bone density study was in 2015 which demonstrated that her lowest T score was at the right femoral neck with a value of -1.9. She has statistically significant decrease in bone mineralization of the AP spine of -4.7% and of the right femoral neck -5%. She had statistically significant increase on her left femoral neck of 9%. She had normal Frax analysis and last year her calcium, vitamin D and PTH within the normal range. Her primary care physician has been doing her blood work.  Patient with no past history of any abnormal Pap smears   Past medical history,surgical history, family history and social history were all reviewed and documented in the EPIC chart.  Gynecologic History No LMP recorded. Patient is postmenopausal. Contraception: post menopausal status Last Pap: 2012. Results were: normal Last mammogram: 2017. Results were: normal  Obstetric History OB History    Gravida Para Term Preterm AB SAB TAB Ectopic Multiple Living  2 1   1 1    1     # Outcome Date GA Lbr Len/2nd Weight Sex Delivery Anes PTL Lv  2 SAB           1 Para                ROS: A ROS was performed and pertinent positives and negatives are included in the history.  GENERAL: No fevers or chills. HEENT: No change in vision, no earache, sore throat or sinus congestion. NECK: No pain or stiffness. CARDIOVASCULAR: No chest pain or pressure. No palpitations. PULMONARY: No shortness of breath, cough or wheeze. GASTROINTESTINAL: No abdominal pain, nausea, vomiting or diarrhea, melena or bright red blood per rectum. GENITOURINARY: No urinary frequency, urgency, hesitancy or dysuria. MUSCULOSKELETAL: No joint or muscle pain, no back pain, no recent trauma. DERMATOLOGIC: No rash, no itching, no lesions. ENDOCRINE: No polyuria, polydipsia, no heat or cold intolerance. No recent change in weight. HEMATOLOGICAL: No anemia or easy bruising or bleeding. NEUROLOGIC: No headache, seizures, numbness, tingling or weakness. PSYCHIATRIC: No depression, no loss of interest in normal activity or change in sleep pattern.     Exam: chaperone present  BP 130/86 mmHg  Ht 5\' 4"  (1.626 m)  Wt 133 lb (60.328 kg)  BMI 22.82 kg/m2  Body Blanchard index is 22.82 kg/(m^2).  General appearance : Well developed well nourished female. No acute distress HEENT: Eyes: no retinal hemorrhage or exudates,  Neck supple,  trachea midline, no carotid bruits, no thyroidmegaly Lungs: Clear to auscultation, no rhonchi or wheezes, or rib retractions  Heart: Regular rate and rhythm, no murmurs or gallops Breast:Examined in sitting and supine position were symmetrical in appearance, no palpable masses or tenderness,  no skin retraction, no nipple inversion, no nipple discharge, no skin discoloration, no axillary or supraclavicular lymphadenopathy Abdomen: no palpable masses or tenderness, no rebound or guarding Extremities: no edema  or skin discoloration or tenderness  Pelvic:  Bartholin, Urethra, Skene Glands: Within normal limits             Vagina: No gross lesions or discharge atrophic changes  Cervix: No gross lesions or discharge  Uterus  axial, normal size, shape and consistency, non-tender and mobile  Adnexa  Without masses or tenderness  Anus and perineum  normal   Rectovaginal  normal sphincter tone without palpated masses or tenderness             Hemoccult PCP provides     Assessment/Plan:  70 y.o. female for annual exam will return to the office in the next 1-2 weeks for bone density study. A copy of her recent blood work done at Dr. Raul Blanchard office will be scanned in place and her records. She was reminded of the importance of calcium vitamin D and weightbearing exercises for osteoporosis prevention. She will discontinue the Vagifem and she will be prescribed estradiol 0.02% from the compound pharmacy to apply one mL prefilled applicator twice a week for her vaginal atrophy.   Holly Mass MD, 2:51 PM 05/16/2015

## 2015-06-03 ENCOUNTER — Ambulatory Visit (INDEPENDENT_AMBULATORY_CARE_PROVIDER_SITE_OTHER): Payer: Medicare Other

## 2015-06-03 DIAGNOSIS — M8589 Other specified disorders of bone density and structure, multiple sites: Secondary | ICD-10-CM

## 2015-06-03 DIAGNOSIS — M899 Disorder of bone, unspecified: Secondary | ICD-10-CM | POA: Diagnosis not present

## 2015-06-03 DIAGNOSIS — M8580 Other specified disorders of bone density and structure, unspecified site: Secondary | ICD-10-CM

## 2016-02-03 DIAGNOSIS — R030 Elevated blood-pressure reading, without diagnosis of hypertension: Secondary | ICD-10-CM | POA: Diagnosis not present

## 2016-02-03 DIAGNOSIS — E784 Other hyperlipidemia: Secondary | ICD-10-CM | POA: Diagnosis not present

## 2016-02-03 DIAGNOSIS — M859 Disorder of bone density and structure, unspecified: Secondary | ICD-10-CM | POA: Diagnosis not present

## 2016-02-10 DIAGNOSIS — Z1389 Encounter for screening for other disorder: Secondary | ICD-10-CM | POA: Diagnosis not present

## 2016-02-10 DIAGNOSIS — Z6822 Body mass index (BMI) 22.0-22.9, adult: Secondary | ICD-10-CM | POA: Diagnosis not present

## 2016-02-10 DIAGNOSIS — M859 Disorder of bone density and structure, unspecified: Secondary | ICD-10-CM | POA: Diagnosis not present

## 2016-02-10 DIAGNOSIS — H268 Other specified cataract: Secondary | ICD-10-CM | POA: Diagnosis not present

## 2016-02-10 DIAGNOSIS — Z0000 Encounter for general adult medical examination without abnormal findings: Secondary | ICD-10-CM | POA: Diagnosis not present

## 2016-02-10 DIAGNOSIS — Z23 Encounter for immunization: Secondary | ICD-10-CM | POA: Diagnosis not present

## 2016-02-10 DIAGNOSIS — E038 Other specified hypothyroidism: Secondary | ICD-10-CM | POA: Diagnosis not present

## 2016-02-10 DIAGNOSIS — R1012 Left upper quadrant pain: Secondary | ICD-10-CM | POA: Diagnosis not present

## 2016-02-10 DIAGNOSIS — E784 Other hyperlipidemia: Secondary | ICD-10-CM | POA: Diagnosis not present

## 2016-02-10 DIAGNOSIS — R030 Elevated blood-pressure reading, without diagnosis of hypertension: Secondary | ICD-10-CM | POA: Diagnosis not present

## 2016-02-13 DIAGNOSIS — Z1212 Encounter for screening for malignant neoplasm of rectum: Secondary | ICD-10-CM | POA: Diagnosis not present

## 2016-02-27 DIAGNOSIS — M545 Low back pain: Secondary | ICD-10-CM | POA: Diagnosis not present

## 2016-03-01 DIAGNOSIS — M545 Low back pain: Secondary | ICD-10-CM | POA: Diagnosis not present

## 2016-03-09 DIAGNOSIS — M545 Low back pain: Secondary | ICD-10-CM | POA: Diagnosis not present

## 2016-03-15 DIAGNOSIS — M545 Low back pain: Secondary | ICD-10-CM | POA: Diagnosis not present

## 2016-03-19 DIAGNOSIS — L300 Nummular dermatitis: Secondary | ICD-10-CM | POA: Diagnosis not present

## 2016-03-19 DIAGNOSIS — L821 Other seborrheic keratosis: Secondary | ICD-10-CM | POA: Diagnosis not present

## 2016-03-19 DIAGNOSIS — I788 Other diseases of capillaries: Secondary | ICD-10-CM | POA: Diagnosis not present

## 2016-03-19 DIAGNOSIS — D1801 Hemangioma of skin and subcutaneous tissue: Secondary | ICD-10-CM | POA: Diagnosis not present

## 2016-04-23 ENCOUNTER — Other Ambulatory Visit: Payer: Self-pay | Admitting: Gynecology

## 2016-04-23 DIAGNOSIS — Z1231 Encounter for screening mammogram for malignant neoplasm of breast: Secondary | ICD-10-CM

## 2016-05-14 ENCOUNTER — Ambulatory Visit: Payer: Medicare Other

## 2016-05-17 ENCOUNTER — Ambulatory Visit
Admission: RE | Admit: 2016-05-17 | Discharge: 2016-05-17 | Disposition: A | Discharge status: Home / Self Care | Payer: Medicare Other | Source: Ambulatory Visit | Attending: Gynecology | Admitting: Gynecology

## 2016-05-17 ENCOUNTER — Encounter: Payer: Medicare Other | Admitting: Gynecology

## 2016-05-17 DIAGNOSIS — Z1231 Encounter for screening mammogram for malignant neoplasm of breast: Secondary | ICD-10-CM

## 2016-05-19 ENCOUNTER — Encounter: Payer: Self-pay | Admitting: Gynecology

## 2016-05-27 DIAGNOSIS — E038 Other specified hypothyroidism: Secondary | ICD-10-CM | POA: Diagnosis not present

## 2016-06-17 ENCOUNTER — Encounter: Payer: Medicare Other | Admitting: Gynecology

## 2016-06-24 ENCOUNTER — Ambulatory Visit (INDEPENDENT_AMBULATORY_CARE_PROVIDER_SITE_OTHER): Payer: Medicare Other | Admitting: Gynecology

## 2016-06-24 ENCOUNTER — Encounter: Payer: Self-pay | Admitting: Gynecology

## 2016-06-24 VITALS — BP 126/78 | Ht 64.0 in | Wt 133.0 lb

## 2016-06-24 DIAGNOSIS — Z79890 Hormone replacement therapy (postmenopausal): Secondary | ICD-10-CM

## 2016-06-24 DIAGNOSIS — Z01419 Encounter for gynecological examination (general) (routine) without abnormal findings: Secondary | ICD-10-CM

## 2016-06-24 DIAGNOSIS — M8580 Other specified disorders of bone density and structure, unspecified site: Secondary | ICD-10-CM

## 2016-06-24 MED ORDER — NONFORMULARY OR COMPOUNDED ITEM: 4 refills | Status: DC

## 2016-06-24 NOTE — Progress Notes (Signed)
Holly Blanchard Jan 13, 1945 [id number]   History:    71 y.o.  for annual gyn exam with no complaints today. Patient is doing well with vaginal estrogen twice a week for vaginal atrophy. She reports no vaginal bleeding. Her PCP Dr. Marton Redwood has been doing her blood work. She was recently diagnosed with hypothyroidism hyperlipidemia and is currently under treatment. Patient had colonoscopy in 2005 indicating colonic polyps and then she had a colonoscopy in 2011 which was negative she was informed she did not need one for 10 years. Her bone density study in 2017 demonstrated the lowest T score was at the right femoral neck with a value of -1.7 and normal Frax analysis. Left femoral neck with a -9.5% decrease in bone mineralization. Patient states that she is taking her calcium and vitamin D a regular basis. PCP recently checked her vitamin D level which she brought the report along with the remainder of her lab and it was normal.  Past medical history,surgical history, family history and social history were all reviewed and documented in the EPIC chart.  Gynecologic History No LMP recorded. Patient is postmenopausal. Contraception: post menopausal status Last Pap: 2012. Results were: normal Last mammogram: 2018. Results were: normal  Obstetric History OB History  Gravida Para Term Preterm AB Living  2 1     1 1   SAB TAB Ectopic Multiple Live Births  1            # Outcome Date GA Lbr Len/2nd Weight Sex Delivery Anes PTL Lv  2 SAB           1 Para                ROS: A ROS was performed and pertinent positives and negatives are included in the history.  GENERAL: No fevers or chills. HEENT: No change in vision, no earache, sore throat or sinus congestion. NECK: No pain or stiffness. CARDIOVASCULAR: No chest pain or pressure. No palpitations. PULMONARY: No shortness of breath, cough or wheeze. GASTROINTESTINAL: No abdominal pain, nausea, vomiting or diarrhea, melena or bright red blood per  rectum. GENITOURINARY: No urinary frequency, urgency, hesitancy or dysuria. MUSCULOSKELETAL: No joint or muscle pain, no back pain, no recent trauma. DERMATOLOGIC: No rash, no itching, no lesions. ENDOCRINE: No polyuria, polydipsia, no heat or cold intolerance. No recent change in weight. HEMATOLOGICAL: No anemia or easy bruising or bleeding. NEUROLOGIC: No headache, seizures, numbness, tingling or weakness. PSYCHIATRIC: No depression, no loss of interest in normal activity or change in sleep pattern.     Exam: chaperone present  BP 126/78   Ht 5\' 4"  (1.626 m)   Wt 133 lb (60.3 kg)   BMI 22.83 kg/m   Body mass index is 22.83 kg/m.  General appearance : Well developed well nourished female. No acute distress HEENT: Eyes: no retinal hemorrhage or exudates,  Neck supple, trachea midline, no carotid bruits, no thyroidmegaly Lungs: Clear to auscultation, no rhonchi or wheezes, or rib retractions  Heart: Regular rate and rhythm, no murmurs or gallops Breast:Examined in sitting and supine position were symmetrical in appearance, no palpable masses or tenderness,  no skin retraction, no nipple inversion, no nipple discharge, no skin discoloration, no axillary or supraclavicular lymphadenopathy Abdomen: no palpable masses or tenderness, no rebound or guarding Extremities: no edema or skin discoloration or tenderness  Pelvic:  Bartholin, Urethra, Skene Glands: Within normal limits             Vagina: No gross lesions  or discharge atrophic changes  Cervix: No gross lesions or discharge  Uterus  anteverted, normal size, shape and consistency, non-tender and mobile  Adnexa  Without masses or tenderness  Anus and perineum  normal   Rectovaginal  normal sphincter tone without palpated masses or tenderness             Hemoccult PCP provides     Assessment/Plan:  71 y.o. female for annual exam doing well on vaginal estrogen with no vaginal bleeding. Prescription refill was provided. She was  reminded that she needs her next bone density study next year. Pap smear no longer indicated. We discussed importance of calcium vitamin D and weightbearing exercises for osteoporosis prevention.   Terrance Mass MD, 3:02 PM 06/24/2016

## 2016-11-15 DIAGNOSIS — H40013 Open angle with borderline findings, low risk, bilateral: Secondary | ICD-10-CM | POA: Diagnosis not present

## 2016-11-15 DIAGNOSIS — H524 Presbyopia: Secondary | ICD-10-CM | POA: Diagnosis not present

## 2016-11-15 DIAGNOSIS — H2513 Age-related nuclear cataract, bilateral: Secondary | ICD-10-CM | POA: Diagnosis not present

## 2016-11-15 DIAGNOSIS — H04123 Dry eye syndrome of bilateral lacrimal glands: Secondary | ICD-10-CM | POA: Diagnosis not present

## 2017-03-21 DIAGNOSIS — D1801 Hemangioma of skin and subcutaneous tissue: Secondary | ICD-10-CM | POA: Diagnosis not present

## 2017-03-21 DIAGNOSIS — L400 Psoriasis vulgaris: Secondary | ICD-10-CM | POA: Diagnosis not present

## 2017-03-21 DIAGNOSIS — L821 Other seborrheic keratosis: Secondary | ICD-10-CM | POA: Diagnosis not present

## 2017-03-21 DIAGNOSIS — Z85828 Personal history of other malignant neoplasm of skin: Secondary | ICD-10-CM | POA: Diagnosis not present

## 2017-03-21 DIAGNOSIS — L218 Other seborrheic dermatitis: Secondary | ICD-10-CM | POA: Diagnosis not present

## 2017-05-12 ENCOUNTER — Other Ambulatory Visit: Payer: Self-pay | Admitting: Obstetrics & Gynecology

## 2017-05-12 DIAGNOSIS — Z1231 Encounter for screening mammogram for malignant neoplasm of breast: Secondary | ICD-10-CM

## 2017-05-16 DIAGNOSIS — H40013 Open angle with borderline findings, low risk, bilateral: Secondary | ICD-10-CM | POA: Diagnosis not present

## 2017-06-03 ENCOUNTER — Ambulatory Visit
Admission: RE | Admit: 2017-06-03 | Discharge: 2017-06-03 | Disposition: A | Discharge status: Home / Self Care | Payer: Medicare Other | Source: Ambulatory Visit | Attending: Obstetrics & Gynecology | Admitting: Obstetrics & Gynecology

## 2017-06-03 DIAGNOSIS — Z1231 Encounter for screening mammogram for malignant neoplasm of breast: Secondary | ICD-10-CM | POA: Diagnosis not present

## 2017-06-07 DIAGNOSIS — M545 Low back pain: Secondary | ICD-10-CM | POA: Diagnosis not present

## 2017-06-07 DIAGNOSIS — M5416 Radiculopathy, lumbar region: Secondary | ICD-10-CM | POA: Diagnosis not present

## 2017-06-10 DIAGNOSIS — M545 Low back pain: Secondary | ICD-10-CM | POA: Diagnosis not present

## 2017-06-10 DIAGNOSIS — M5416 Radiculopathy, lumbar region: Secondary | ICD-10-CM | POA: Diagnosis not present

## 2017-06-13 DIAGNOSIS — M545 Low back pain: Secondary | ICD-10-CM | POA: Diagnosis not present

## 2017-06-13 DIAGNOSIS — M5416 Radiculopathy, lumbar region: Secondary | ICD-10-CM | POA: Diagnosis not present

## 2017-06-17 DIAGNOSIS — M5416 Radiculopathy, lumbar region: Secondary | ICD-10-CM | POA: Diagnosis not present

## 2017-06-17 DIAGNOSIS — M545 Low back pain: Secondary | ICD-10-CM | POA: Diagnosis not present

## 2017-06-21 DIAGNOSIS — M5416 Radiculopathy, lumbar region: Secondary | ICD-10-CM | POA: Diagnosis not present

## 2017-06-21 DIAGNOSIS — M545 Low back pain: Secondary | ICD-10-CM | POA: Diagnosis not present

## 2017-06-24 DIAGNOSIS — M545 Low back pain: Secondary | ICD-10-CM | POA: Diagnosis not present

## 2017-06-24 DIAGNOSIS — M5416 Radiculopathy, lumbar region: Secondary | ICD-10-CM | POA: Diagnosis not present

## 2017-06-27 ENCOUNTER — Encounter: Payer: Medicare Other | Admitting: Obstetrics & Gynecology

## 2017-06-27 DIAGNOSIS — M545 Low back pain: Secondary | ICD-10-CM | POA: Diagnosis not present

## 2017-06-27 DIAGNOSIS — M5416 Radiculopathy, lumbar region: Secondary | ICD-10-CM | POA: Diagnosis not present

## 2017-06-28 ENCOUNTER — Encounter: Payer: Medicare Other | Admitting: Obstetrics & Gynecology

## 2017-07-06 DIAGNOSIS — M545 Low back pain: Secondary | ICD-10-CM | POA: Diagnosis not present

## 2017-07-18 DIAGNOSIS — M5416 Radiculopathy, lumbar region: Secondary | ICD-10-CM | POA: Diagnosis not present

## 2017-08-11 DIAGNOSIS — R82998 Other abnormal findings in urine: Secondary | ICD-10-CM | POA: Diagnosis not present

## 2017-08-11 DIAGNOSIS — M859 Disorder of bone density and structure, unspecified: Secondary | ICD-10-CM | POA: Diagnosis not present

## 2017-08-11 DIAGNOSIS — E038 Other specified hypothyroidism: Secondary | ICD-10-CM | POA: Diagnosis not present

## 2017-08-11 DIAGNOSIS — E7849 Other hyperlipidemia: Secondary | ICD-10-CM | POA: Diagnosis not present

## 2017-08-18 DIAGNOSIS — R030 Elevated blood-pressure reading, without diagnosis of hypertension: Secondary | ICD-10-CM | POA: Diagnosis not present

## 2017-08-18 DIAGNOSIS — Z1389 Encounter for screening for other disorder: Secondary | ICD-10-CM | POA: Diagnosis not present

## 2017-08-18 DIAGNOSIS — M8580 Other specified disorders of bone density and structure, unspecified site: Secondary | ICD-10-CM | POA: Diagnosis not present

## 2017-08-18 DIAGNOSIS — E038 Other specified hypothyroidism: Secondary | ICD-10-CM | POA: Diagnosis not present

## 2017-08-18 DIAGNOSIS — M5416 Radiculopathy, lumbar region: Secondary | ICD-10-CM | POA: Diagnosis not present

## 2017-08-18 DIAGNOSIS — H268 Other specified cataract: Secondary | ICD-10-CM | POA: Diagnosis not present

## 2017-08-18 DIAGNOSIS — Z0000 Encounter for general adult medical examination without abnormal findings: Secondary | ICD-10-CM | POA: Diagnosis not present

## 2017-08-18 DIAGNOSIS — E7849 Other hyperlipidemia: Secondary | ICD-10-CM | POA: Diagnosis not present

## 2017-08-26 DIAGNOSIS — Z1212 Encounter for screening for malignant neoplasm of rectum: Secondary | ICD-10-CM | POA: Diagnosis not present

## 2017-09-27 DIAGNOSIS — E038 Other specified hypothyroidism: Secondary | ICD-10-CM | POA: Diagnosis not present

## 2017-09-27 DIAGNOSIS — R030 Elevated blood-pressure reading, without diagnosis of hypertension: Secondary | ICD-10-CM | POA: Diagnosis not present

## 2017-09-27 DIAGNOSIS — Z6822 Body mass index (BMI) 22.0-22.9, adult: Secondary | ICD-10-CM | POA: Diagnosis not present

## 2017-10-11 ENCOUNTER — Encounter: Payer: Medicare Other | Admitting: Obstetrics & Gynecology

## 2017-10-25 DIAGNOSIS — E038 Other specified hypothyroidism: Secondary | ICD-10-CM | POA: Diagnosis not present

## 2017-10-25 DIAGNOSIS — I10 Essential (primary) hypertension: Secondary | ICD-10-CM | POA: Diagnosis not present

## 2017-11-14 DIAGNOSIS — H2513 Age-related nuclear cataract, bilateral: Secondary | ICD-10-CM | POA: Diagnosis not present

## 2017-11-14 DIAGNOSIS — H25013 Cortical age-related cataract, bilateral: Secondary | ICD-10-CM | POA: Diagnosis not present

## 2017-11-14 DIAGNOSIS — H40013 Open angle with borderline findings, low risk, bilateral: Secondary | ICD-10-CM | POA: Diagnosis not present

## 2017-11-14 DIAGNOSIS — H52203 Unspecified astigmatism, bilateral: Secondary | ICD-10-CM | POA: Diagnosis not present

## 2018-03-21 DIAGNOSIS — L812 Freckles: Secondary | ICD-10-CM | POA: Diagnosis not present

## 2018-03-21 DIAGNOSIS — L400 Psoriasis vulgaris: Secondary | ICD-10-CM | POA: Diagnosis not present

## 2018-03-21 DIAGNOSIS — L821 Other seborrheic keratosis: Secondary | ICD-10-CM | POA: Diagnosis not present

## 2018-03-21 DIAGNOSIS — D1801 Hemangioma of skin and subcutaneous tissue: Secondary | ICD-10-CM | POA: Diagnosis not present

## 2018-03-21 DIAGNOSIS — Z85828 Personal history of other malignant neoplasm of skin: Secondary | ICD-10-CM | POA: Diagnosis not present

## 2018-08-16 DIAGNOSIS — E7849 Other hyperlipidemia: Secondary | ICD-10-CM | POA: Diagnosis not present

## 2018-08-16 DIAGNOSIS — I10 Essential (primary) hypertension: Secondary | ICD-10-CM | POA: Diagnosis not present

## 2018-08-16 DIAGNOSIS — E038 Other specified hypothyroidism: Secondary | ICD-10-CM | POA: Diagnosis not present

## 2018-08-16 DIAGNOSIS — M859 Disorder of bone density and structure, unspecified: Secondary | ICD-10-CM | POA: Diagnosis not present

## 2018-08-21 ENCOUNTER — Other Ambulatory Visit: Payer: Self-pay | Admitting: Internal Medicine

## 2018-08-21 DIAGNOSIS — Z1231 Encounter for screening mammogram for malignant neoplasm of breast: Secondary | ICD-10-CM

## 2018-08-22 DIAGNOSIS — R82998 Other abnormal findings in urine: Secondary | ICD-10-CM | POA: Diagnosis not present

## 2018-08-23 DIAGNOSIS — M8580 Other specified disorders of bone density and structure, unspecified site: Secondary | ICD-10-CM | POA: Diagnosis not present

## 2018-08-23 DIAGNOSIS — R197 Diarrhea, unspecified: Secondary | ICD-10-CM | POA: Diagnosis not present

## 2018-08-23 DIAGNOSIS — I10 Essential (primary) hypertension: Secondary | ICD-10-CM | POA: Diagnosis not present

## 2018-08-23 DIAGNOSIS — Z0000 Encounter for general adult medical examination without abnormal findings: Secondary | ICD-10-CM | POA: Diagnosis not present

## 2018-08-23 DIAGNOSIS — E785 Hyperlipidemia, unspecified: Secondary | ICD-10-CM | POA: Diagnosis not present

## 2018-08-23 DIAGNOSIS — E039 Hypothyroidism, unspecified: Secondary | ICD-10-CM | POA: Diagnosis not present

## 2018-08-23 DIAGNOSIS — Z1339 Encounter for screening examination for other mental health and behavioral disorders: Secondary | ICD-10-CM | POA: Diagnosis not present

## 2018-08-23 DIAGNOSIS — Z1331 Encounter for screening for depression: Secondary | ICD-10-CM | POA: Diagnosis not present

## 2018-09-15 ENCOUNTER — Encounter: Payer: Self-pay | Admitting: Nurse Practitioner

## 2018-09-18 DIAGNOSIS — R197 Diarrhea, unspecified: Secondary | ICD-10-CM | POA: Diagnosis not present

## 2018-09-29 ENCOUNTER — Ambulatory Visit (INDEPENDENT_AMBULATORY_CARE_PROVIDER_SITE_OTHER): Payer: Medicare Other | Admitting: Nurse Practitioner

## 2018-09-29 ENCOUNTER — Other Ambulatory Visit: Payer: Self-pay

## 2018-09-29 ENCOUNTER — Encounter: Payer: Self-pay | Admitting: Nurse Practitioner

## 2018-09-29 VITALS — BP 124/72 | HR 63 | Temp 97.9°F | Ht 64.0 in | Wt 128.5 lb

## 2018-09-29 DIAGNOSIS — R197 Diarrhea, unspecified: Secondary | ICD-10-CM

## 2018-09-29 NOTE — Progress Notes (Signed)
ASSESSMENT / PLAN:   73 year old female with 3 months of diarrhea.  Diarrhea can after patient started Olmesartan. Stool studies negative. Rule out sprue-like enteropathy secondary to Olmesartan.  -I will send my note to her PCP but in the interim asked that patient call Dr. Brigitte Pulse about holding olmesartan / substituting with something else for time being.  Diarrhea resolves then we may have an answer.  Otherwise proceed with colonoscopy to rule out other etiologies such as microscopic colitis.  Patient will let me know. Marland Kitchen    HPI:    Referring Provider:   Marton Redwood, MD    Reason for referral:   Diarrhea  Chief Complaint:   Diarrhea  Patient is a 73 year old female previously known to Dr. Deatra Ina from a screening colonoscopy in 2011, she has not been seen since.  PMH significant for hypothyroidism, shingles, hypertension and osteopenia.   In late May patient was started on olmesartan for hypertension.  Sometime in June she developed severe diarrhea.  Diarrhea persisted and July it was hard for her to even go on a walk without having diarrhea.  She had not started any other new medications or changed her diet.  At one point PCP reduced the dose of olmesartan and diarrhea improved.  Apparently hypertension worsened and she had to resume original dose of the olmesartan.  Over the weekend patient decided to hold olmesartan herself and try Metamucil.  Diarrhea nearly resolved altogether.  All studies were negative.  See below for remaining labs.  Diarrhea has been nonbloody.  She has not had any associated fevers, abdominal pain or weight loss.  Data Reviewed:   TSH 1.74 WBC 4.8, hemoglobin 13.5, MCV high at 104, platelets 231 Liver function studies normal   Past Medical History:  Diagnosis Date  . Cancer ([place])    basal cell- left leg removed  . Migraines    history of, last one 2008  . Osteopenia      Past Surgical History:  Procedure Laterality Date  . BREAST  EXCISIONAL BIOPSY Left 08/17/2001  . BREAST SURGERY Left    biospy  . LUMBAR LAMINECTOMY/DECOMPRESSION MICRODISCECTOMY Left 06/21/2012   Procedure: LUMBAR LAMINECTOMY/DECOMPRESSION MICRODISCECTOMY;  Surgeon: Sinclair Ship, MD;  Location: [facility];  Service: Orthopedics;  Laterality: Left;  Left sided lumbar 5-sacrum 1 microdisectomy  . NECK SURGERY  1982   right side- lymphnode removed   Family History  Problem Relation Age of Onset  . Hypertension Mother   . Breast cancer Mother 32       lump removed only  . Cancer Maternal Aunt        colon   Social History   Tobacco Use  . Smoking status: Never Smoker  . Smokeless tobacco: Never Used  Substance Use Topics  . Alcohol use: Yes    Alcohol/week: 7.0 standard drinks    Types: 7 Glasses of wine per week    Comment: wine  . Drug use: No   Current Outpatient Medications  Medication Sig Dispense Refill  . calcium carbonate (OS-CAL) 600 MG TABS Take 600 mg by mouth 2 (two) times daily with a meal.    . cholecalciferol (VITAMIN D) 1000 UNITS tablet Take 1,000 Units by mouth daily.    Marland Kitchen olmesartan (BENICAR) 20 MG tablet Take 20 mg by mouth daily.    Marland Kitchen OVER THE COUNTER MEDICATION Apply 1 drop to eye 2 (two) times  daily as needed. For dry eyes. Hydro Tears    . rosuvastatin (CRESTOR) 10 MG tablet Take 10 mg by mouth daily.    Marland Kitchen SYNTHROID 50 MCG tablet Take 1 tablet by mouth daily.  2   No current facility-administered medications for this visit.    Allergies  Allergen Reactions  . Biaxin [Clarithromycin]     REACTION: rash  . Fish Oil     REACTION: itching  . Raloxifene     REACTION: leg cramps     Review of Systems: All systems reviewed and negative except where noted in HPI.    Physical Exam:    Wt Readings from Last 3 Encounters:  09/29/18 128 lb 8 oz (58.3 kg)  06/24/16 133 lb (60.3 kg)  05/16/15 133 lb (60.3 kg)    BP 124/72 (BP Location: Left Arm, Patient Position: Sitting, Cuff Size: Normal)   Pulse 63    Temp 97.9 F (36.6 C) (Oral)   Ht 5\' 4"  (1.626 m)   Wt 128 lb 8 oz (58.3 kg)   BMI 22.06 kg/m  Constitutional:  Pleasant female in no acute distress. Psychiatric: Normal mood and affect. Behavior is normal. EENT: Pupils normal.  Conjunctivae are normal. No scleral icterus. Neck supple.  Cardiovascular: Normal rate, regular rhythm. No edema Pulmonary/chest: Effort normal and breath sounds normal. No wheezing, rales or rhonchi. Abdominal: Soft, nondistended, nontender. Bowel sounds active throughout. There are no masses palpable. No hepatomegaly. Neurological: Alert and oriented to person place and time. Skin: Skin is warm and dry. No rashes noted.  Tye Savoy, NP  09/29/2018, 5:58 PM  Cc: Marton Redwood, MD

## 2018-09-29 NOTE — Patient Instructions (Signed)
Talk with your PCP about discontinuing Omasartan.  If you do and diarrhea persists, call our office to se about scheduling e a colonoscopy

## 2018-10-02 ENCOUNTER — Ambulatory Visit
Admission: RE | Admit: 2018-10-02 | Discharge: 2018-10-02 | Disposition: A | Discharge status: Home / Self Care | Payer: Medicare Other | Source: Ambulatory Visit | Attending: Internal Medicine | Admitting: Internal Medicine

## 2018-10-02 ENCOUNTER — Other Ambulatory Visit: Payer: Self-pay

## 2018-10-02 DIAGNOSIS — Z1231 Encounter for screening mammogram for malignant neoplasm of breast: Secondary | ICD-10-CM | POA: Diagnosis not present

## 2018-10-02 NOTE — Addendum Note (Signed)
Addended by: Lavena Bullion on: 10/02/2018 08:22 AM   Modules accepted: Level of Service

## 2018-10-02 NOTE — Progress Notes (Signed)
Agree with the assessment and plan as outlined by Rubbie Battiest, NP.  Will evaluate for clinical improvement by holding home losartan pending input from Dr. Brigitte Pulse.  If incomplete or no clinical improvement, agree with plan for colonoscopy with random and directed colonic biopsies.  Danaya Geddis, DO, [facility]

## 2018-10-13 DIAGNOSIS — H40013 Open angle with borderline findings, low risk, bilateral: Secondary | ICD-10-CM | POA: Diagnosis not present

## 2019-02-05 ENCOUNTER — Ambulatory Visit: Payer: Medicare Other

## 2019-02-11 ENCOUNTER — Ambulatory Visit: Payer: Medicare Other | Attending: Internal Medicine

## 2019-02-11 DIAGNOSIS — Z23 Encounter for immunization: Secondary | ICD-10-CM | POA: Insufficient documentation

## 2019-02-11 NOTE — Progress Notes (Signed)
   Covid-19 Vaccination Clinic  Name:  Holly Blanchard    MRN: [id number] DOB: Apr 11, 1945  02/11/2019  Ms. Nine was observed post Covid-19 immunization for 15 minutes without incidence. She was provided with Vaccine Information Sheet and instruction to access the V-Safe system.   Ms. Prinzo was instructed to call 911 with any severe reactions post vaccine: Marland Kitchen Difficulty breathing  . Swelling of your face and throat  . A fast heartbeat  . A bad rash all over your body  . Dizziness and weakness    Immunizations Administered    Name Date Dose VIS Date Route   Pfizer COVID-19 Vaccine 02/11/2019 10:34 AM 0.3 mL 12/15/2018 Intramuscular   Manufacturer: [place]   Lot: [id number]   [place]: [id number]

## 2019-02-26 ENCOUNTER — Ambulatory Visit: Payer: Medicare Other

## 2019-03-07 ENCOUNTER — Ambulatory Visit: Payer: Medicare Other

## 2019-03-07 ENCOUNTER — Ambulatory Visit: Payer: Medicare Other | Attending: Internal Medicine

## 2019-03-07 DIAGNOSIS — Z23 Encounter for immunization: Secondary | ICD-10-CM

## 2019-03-07 NOTE — Progress Notes (Signed)
   Covid-19 Vaccination Clinic  Name:  Holly Blanchard    MRN: [id number] DOB: Feb 04, 1945  03/07/2019  [name] was observed post Covid-19 immunization for 15 minutes without incident. She was provided with Vaccine Information Sheet and instruction to access the V-Safe system.   [name] was instructed to call 911 with any severe reactions post vaccine: Marland Kitchen Difficulty breathing  . Swelling of face and throat  . A fast heartbeat  . A bad rash all over body  . Dizziness and weakness   Immunizations Administered    Name Date Dose VIS Date Route   Pfizer COVID-19 Vaccine 03/07/2019 11:27 AM 0.3 mL 12/15/2018 Intramuscular   Manufacturer: [place]   Lot: [id number]   [place]: [id number]

## 2019-03-16 ENCOUNTER — Encounter: Payer: Medicare Other | Admitting: Gastroenterology

## 2019-04-13 DIAGNOSIS — H524 Presbyopia: Secondary | ICD-10-CM | POA: Diagnosis not present

## 2019-04-13 DIAGNOSIS — H2513 Age-related nuclear cataract, bilateral: Secondary | ICD-10-CM | POA: Diagnosis not present

## 2019-04-13 DIAGNOSIS — H25013 Cortical age-related cataract, bilateral: Secondary | ICD-10-CM | POA: Diagnosis not present

## 2019-04-13 DIAGNOSIS — H52203 Unspecified astigmatism, bilateral: Secondary | ICD-10-CM | POA: Diagnosis not present

## 2019-04-16 DIAGNOSIS — D1801 Hemangioma of skin and subcutaneous tissue: Secondary | ICD-10-CM | POA: Diagnosis not present

## 2019-04-16 DIAGNOSIS — L309 Dermatitis, unspecified: Secondary | ICD-10-CM | POA: Diagnosis not present

## 2019-04-16 DIAGNOSIS — Z85828 Personal history of other malignant neoplasm of skin: Secondary | ICD-10-CM | POA: Diagnosis not present

## 2019-04-16 DIAGNOSIS — L820 Inflamed seborrheic keratosis: Secondary | ICD-10-CM | POA: Diagnosis not present

## 2019-04-16 DIAGNOSIS — L812 Freckles: Secondary | ICD-10-CM | POA: Diagnosis not present

## 2019-06-22 ENCOUNTER — Other Ambulatory Visit: Payer: Self-pay

## 2019-06-25 ENCOUNTER — Other Ambulatory Visit: Payer: Self-pay

## 2019-06-25 ENCOUNTER — Ambulatory Visit (INDEPENDENT_AMBULATORY_CARE_PROVIDER_SITE_OTHER): Payer: Medicare Other | Admitting: Obstetrics and Gynecology

## 2019-06-25 ENCOUNTER — Encounter: Payer: Self-pay | Admitting: Obstetrics and Gynecology

## 2019-06-25 VITALS — BP 134/82 | Ht 64.0 in | Wt 132.0 lb

## 2019-06-25 DIAGNOSIS — Z01419 Encounter for gynecological examination (general) (routine) without abnormal findings: Secondary | ICD-10-CM

## 2019-06-25 DIAGNOSIS — M8580 Other specified disorders of bone density and structure, unspecified site: Secondary | ICD-10-CM

## 2019-06-25 DIAGNOSIS — M8588 Other specified disorders of bone density and structure, other site: Secondary | ICD-10-CM

## 2019-06-25 NOTE — Progress Notes (Signed)
   Eleisha Branscomb Humble 1945-05-30 [id number]  SUBJECTIVE:  74 y.o. G2P1011 female for a pelvic and breast exam . She has no gynecologic concerns.   Current Outpatient Medications  Medication Sig Dispense Refill  . calcium carbonate (OS-CAL) 600 MG TABS Take 600 mg by mouth 2 (two) times daily with a meal.    . cholecalciferol (VITAMIN D) 1000 UNITS tablet Take 1,000 Units by mouth daily.    . hydrochlorothiazide (HYDRODIURIL) 12.5 MG tablet Take 12.5 mg by mouth daily.    Marland Kitchen OVER THE COUNTER MEDICATION Apply 1 drop to eye 2 (two) times daily as needed. For dry eyes. Hydro Tears    . rosuvastatin (CRESTOR) 10 MG tablet Take 10 mg by mouth daily.    Marland Kitchen SYNTHROID 50 MCG tablet Take 1 tablet by mouth daily.  2   No current facility-administered medications for this visit.   Allergies: Amlodipine, Biaxin [clarithromycin], Fish oil, Olmesartan, and Raloxifene  No LMP recorded. Patient is postmenopausal.  Past medical history,surgical history, problem list, medications, allergies, family history and social history were all reviewed and documented as reviewed in the EPIC chart.  ROS:  Feeling well. No dyspnea or chest pain on exertion.  No abdominal pain, change in bowel habits, black or bloody stools.  No urinary tract symptoms. GYN ROS:  no abnormal bleeding, pelvic pain or discharge, no breast pain or new or enlarging lumps on self exam. No neurological complaints.   OBJECTIVE:  BP 134/82   Ht 5\' 4"  (1.626 m)   Wt 132 lb (59.9 kg)   BMI 22.66 kg/m  The patient appears well, alert, oriented x 3, in no distress. ENT normal.  Neck supple. No cervical or supraclavicular adenopathy or thyromegaly.  Lungs are clear, good air entry, no wheezes, rhonchi or rales. S1 and S2 normal, no murmurs, regular rate and rhythm.  Abdomen soft without tenderness, guarding, mass or organomegaly.  Neurological is normal, no focal findings.  BREAST EXAM: breasts appear normal, no suspicious masses, no skin or  nipple changes or axillary nodes  PELVIC EXAM: VULVA: normal appearing vulva with no masses, tenderness or lesions, atrophic changes, VAGINA: normal appearing vagina with normal color and discharge, no lesions, atrophic changes, CERVIX: normal appearing atrophic cervix without discharge or lesions, UTERUS: uterus is normal size, shape, consistency and nontender, ADNEXA: normal adnexa in size, nontender and no masses  Chaperone: Caryn Bee present during the examination  ASSESSMENT:  74 y.o. G2P1011 here for annual gynecologic exam  PLAN:   1. Postmenopausal. No significant hot flashes or night sweats. No vaginal bleeding. 2. Pap smear 02/2010.  No significant history of abnormal Pap smears.  Comfortable with not screening based on age criteria according to the most recent guidelines. 3. Mammogram 09/2018.  Normal breast exam today.  Continue with annual mammogram this year when due. 4. Colonoscopy 2011.  Due this year and she is planning to make an appointment to get this testing completed. 5.  Osteopenia.  DEXA 05/2015.  T score -1.7.  Next DEXA recommended that is overdue, so she plans to schedule this at checkout today.  She says she is supplementing with vitamin D and calcium. 6. Health maintenance.  No labs today as she normally has these completed with her primary care doctor.  Return annually or sooner, prn.  Joseph Pierini MD 06/25/19

## 2019-07-02 ENCOUNTER — Other Ambulatory Visit: Payer: Self-pay

## 2019-07-03 ENCOUNTER — Other Ambulatory Visit: Payer: Self-pay | Admitting: Obstetrics and Gynecology

## 2019-07-03 ENCOUNTER — Ambulatory Visit (INDEPENDENT_AMBULATORY_CARE_PROVIDER_SITE_OTHER): Payer: Medicare Other

## 2019-07-03 DIAGNOSIS — Z780 Asymptomatic menopausal state: Secondary | ICD-10-CM

## 2019-07-03 DIAGNOSIS — M8589 Other specified disorders of bone density and structure, multiple sites: Secondary | ICD-10-CM

## 2019-07-03 DIAGNOSIS — M8580 Other specified disorders of bone density and structure, unspecified site: Secondary | ICD-10-CM

## 2019-07-03 DIAGNOSIS — M8588 Other specified disorders of bone density and structure, other site: Secondary | ICD-10-CM

## 2019-08-13 DIAGNOSIS — L218 Other seborrheic dermatitis: Secondary | ICD-10-CM | POA: Diagnosis not present

## 2019-08-13 DIAGNOSIS — Z85828 Personal history of other malignant neoplasm of skin: Secondary | ICD-10-CM | POA: Diagnosis not present

## 2019-08-21 DIAGNOSIS — H04123 Dry eye syndrome of bilateral lacrimal glands: Secondary | ICD-10-CM | POA: Diagnosis not present

## 2019-08-21 DIAGNOSIS — E039 Hypothyroidism, unspecified: Secondary | ICD-10-CM | POA: Diagnosis not present

## 2019-08-21 DIAGNOSIS — M859 Disorder of bone density and structure, unspecified: Secondary | ICD-10-CM | POA: Diagnosis not present

## 2019-08-21 DIAGNOSIS — M8580 Other specified disorders of bone density and structure, unspecified site: Secondary | ICD-10-CM | POA: Diagnosis not present

## 2019-08-21 DIAGNOSIS — H40013 Open angle with borderline findings, low risk, bilateral: Secondary | ICD-10-CM | POA: Diagnosis not present

## 2019-08-21 DIAGNOSIS — E781 Pure hyperglyceridemia: Secondary | ICD-10-CM | POA: Diagnosis not present

## 2019-08-28 ENCOUNTER — Other Ambulatory Visit: Payer: Self-pay | Admitting: Internal Medicine

## 2019-08-28 DIAGNOSIS — E785 Hyperlipidemia, unspecified: Secondary | ICD-10-CM | POA: Diagnosis not present

## 2019-08-28 DIAGNOSIS — M8580 Other specified disorders of bone density and structure, unspecified site: Secondary | ICD-10-CM | POA: Diagnosis not present

## 2019-08-28 DIAGNOSIS — I10 Essential (primary) hypertension: Secondary | ICD-10-CM | POA: Diagnosis not present

## 2019-08-28 DIAGNOSIS — E781 Pure hyperglyceridemia: Secondary | ICD-10-CM | POA: Diagnosis not present

## 2019-08-28 DIAGNOSIS — M5416 Radiculopathy, lumbar region: Secondary | ICD-10-CM | POA: Diagnosis not present

## 2019-08-28 DIAGNOSIS — E039 Hypothyroidism, unspecified: Secondary | ICD-10-CM | POA: Diagnosis not present

## 2019-08-28 DIAGNOSIS — Z1212 Encounter for screening for malignant neoplasm of rectum: Secondary | ICD-10-CM | POA: Diagnosis not present

## 2019-08-28 DIAGNOSIS — Z0000 Encounter for general adult medical examination without abnormal findings: Secondary | ICD-10-CM | POA: Diagnosis not present

## 2019-08-28 DIAGNOSIS — R82998 Other abnormal findings in urine: Secondary | ICD-10-CM | POA: Diagnosis not present

## 2019-09-06 ENCOUNTER — Ambulatory Visit
Admission: RE | Admit: 2019-09-06 | Discharge: 2019-09-06 | Disposition: A | Discharge status: Home / Self Care | Payer: Self-pay | Source: Ambulatory Visit | Attending: Internal Medicine | Admitting: Internal Medicine

## 2019-09-06 DIAGNOSIS — E785 Hyperlipidemia, unspecified: Secondary | ICD-10-CM

## 2019-09-06 DIAGNOSIS — I2510 Atherosclerotic heart disease of native coronary artery without angina pectoris: Secondary | ICD-10-CM | POA: Diagnosis not present

## 2019-11-01 DIAGNOSIS — K117 Disturbances of salivary secretion: Secondary | ICD-10-CM | POA: Diagnosis not present

## 2019-11-09 ENCOUNTER — Other Ambulatory Visit: Payer: Self-pay | Admitting: Internal Medicine

## 2019-11-09 DIAGNOSIS — Z1231 Encounter for screening mammogram for malignant neoplasm of breast: Secondary | ICD-10-CM

## 2019-11-12 ENCOUNTER — Ambulatory Visit
Admission: RE | Admit: 2019-11-12 | Discharge: 2019-11-12 | Disposition: A | Discharge status: Home / Self Care | Payer: Medicare Other | Source: Ambulatory Visit | Attending: Internal Medicine | Admitting: Internal Medicine

## 2019-11-12 ENCOUNTER — Other Ambulatory Visit: Payer: Self-pay

## 2019-11-12 DIAGNOSIS — Z1231 Encounter for screening mammogram for malignant neoplasm of breast: Secondary | ICD-10-CM | POA: Diagnosis not present

## 2019-11-23 DIAGNOSIS — Z23 Encounter for immunization: Secondary | ICD-10-CM | POA: Diagnosis not present

## 2020-03-07 DIAGNOSIS — I10 Essential (primary) hypertension: Secondary | ICD-10-CM | POA: Diagnosis not present

## 2020-04-15 DIAGNOSIS — L820 Inflamed seborrheic keratosis: Secondary | ICD-10-CM | POA: Diagnosis not present

## 2020-04-15 DIAGNOSIS — I788 Other diseases of capillaries: Secondary | ICD-10-CM | POA: Diagnosis not present

## 2020-04-15 DIAGNOSIS — B078 Other viral warts: Secondary | ICD-10-CM | POA: Diagnosis not present

## 2020-04-15 DIAGNOSIS — L309 Dermatitis, unspecified: Secondary | ICD-10-CM | POA: Diagnosis not present

## 2020-04-15 DIAGNOSIS — D1801 Hemangioma of skin and subcutaneous tissue: Secondary | ICD-10-CM | POA: Diagnosis not present

## 2020-04-15 DIAGNOSIS — Z85828 Personal history of other malignant neoplasm of skin: Secondary | ICD-10-CM | POA: Diagnosis not present

## 2020-05-06 DIAGNOSIS — H2513 Age-related nuclear cataract, bilateral: Secondary | ICD-10-CM | POA: Diagnosis not present

## 2020-05-06 DIAGNOSIS — H5213 Myopia, bilateral: Secondary | ICD-10-CM | POA: Diagnosis not present

## 2020-05-06 DIAGNOSIS — H04123 Dry eye syndrome of bilateral lacrimal glands: Secondary | ICD-10-CM | POA: Diagnosis not present

## 2020-05-06 DIAGNOSIS — H40013 Open angle with borderline findings, low risk, bilateral: Secondary | ICD-10-CM | POA: Diagnosis not present

## 2020-06-06 ENCOUNTER — Encounter: Payer: Medicare Other | Admitting: Gastroenterology

## 2020-06-25 ENCOUNTER — Encounter: Payer: Medicare Other | Admitting: Obstetrics and Gynecology

## 2020-06-30 ENCOUNTER — Encounter: Payer: Self-pay | Admitting: Obstetrics & Gynecology

## 2020-06-30 ENCOUNTER — Ambulatory Visit (INDEPENDENT_AMBULATORY_CARE_PROVIDER_SITE_OTHER): Payer: Medicare Other | Admitting: Obstetrics & Gynecology

## 2020-06-30 ENCOUNTER — Other Ambulatory Visit: Payer: Self-pay

## 2020-06-30 VITALS — BP 112/68 | HR 79 | Resp 16 | Ht 63.5 in | Wt 133.0 lb

## 2020-06-30 DIAGNOSIS — Z780 Asymptomatic menopausal state: Secondary | ICD-10-CM | POA: Diagnosis not present

## 2020-06-30 DIAGNOSIS — M85851 Other specified disorders of bone density and structure, right thigh: Secondary | ICD-10-CM | POA: Diagnosis not present

## 2020-06-30 DIAGNOSIS — M85852 Other specified disorders of bone density and structure, left thigh: Secondary | ICD-10-CM | POA: Diagnosis not present

## 2020-06-30 DIAGNOSIS — Z01419 Encounter for gynecological examination (general) (routine) without abnormal findings: Secondary | ICD-10-CM | POA: Diagnosis not present

## 2020-06-30 DIAGNOSIS — N904 Leukoplakia of vulva: Secondary | ICD-10-CM

## 2020-06-30 MED ORDER — CLOBETASOL PROPIONATE 0.05 % EX CREA: 1.0000 "application " | TOPICAL_CREAM | CUTANEOUS | 4 refills | Status: DC

## 2020-06-30 NOTE — Progress Notes (Signed)
Holly Blanchard 07-13-1945 [id number]   History:    75 y.o. G2P1A1L1 Widowed.  RP:  Established patient presenting for annual gyn exam   HPI: Postmenopausal. No significant hot flashes or night sweats. No vaginal bleeding. No Pelvic pain.  Abstinent.  No h/o abnormal Pap.  Breasts normal.  BMI 23.19.  Colonoscopy 2011, will schedule at the end of the summer.  Osteopenia.  DEXA 06/2019 stable with T score -1.6.  Supplementing with vitamin D and calcium.  Health labs with primary care doctor.  Past medical history,surgical history, family history and social history were all reviewed and documented in the EPIC chart.  Gynecologic History No LMP recorded. Patient is postmenopausal.  Obstetric History OB History  Gravida Para Term Preterm AB Living  2 1     1 1   SAB IAB Ectopic Multiple Live Births  1            # Outcome Date GA Lbr Len/2nd Weight Sex Delivery Anes PTL Lv  2 SAB           1 Para              ROS: A ROS was performed and pertinent positives and negatives are included in the history.  GENERAL: No fevers or chills. HEENT: No change in vision, no earache, sore throat or sinus congestion. NECK: No pain or stiffness. CARDIOVASCULAR: No chest pain or pressure. No palpitations. PULMONARY: No shortness of breath, cough or wheeze. GASTROINTESTINAL: No abdominal pain, nausea, vomiting or diarrhea, melena or bright red blood per rectum. GENITOURINARY: No urinary frequency, urgency, hesitancy or dysuria. MUSCULOSKELETAL: No joint or muscle pain, no back pain, no recent trauma. DERMATOLOGIC: No rash, no itching, no lesions. ENDOCRINE: No polyuria, polydipsia, no heat or cold intolerance. No recent change in weight. HEMATOLOGICAL: No anemia or easy bruising or bleeding. NEUROLOGIC: No headache, seizures, numbness, tingling or weakness. PSYCHIATRIC: No depression, no loss of interest in normal activity or change in sleep pattern.     Exam:   BP 112/68   Pulse 79   Resp 16   Ht  5' 3.5" (1.613 m)   Wt 133 lb (60.3 kg)   BMI 23.19 kg/m   Body mass index is 23.19 kg/m.  General appearance : Well developed well nourished female. No acute distress HEENT: Eyes: no retinal hemorrhage or exudates,  Neck supple, trachea midline, no carotid bruits, no thyroidmegaly Lungs: Clear to auscultation, no rhonchi or wheezes, or rib retractions  Heart: Regular rate and rhythm, no murmurs or gallops Breast:Examined in sitting and supine position were symmetrical in appearance, no palpable masses or tenderness,  no skin retraction, no nipple inversion, no nipple discharge, no skin discoloration, no axillary or supraclavicular lymphadenopathy Abdomen: no palpable masses or tenderness, no rebound or guarding Extremities: no edema or skin discoloration or tenderness  Pelvic: Vulva:  Butterfly white atrophy c/w Lichen Sclerosus             Vagina: No gross lesions or discharge  Cervix: No gross lesions or discharge  Uterus  AV, normal size, shape and consistency, non-tender and mobile  Adnexa  Without masses or tenderness  Anus: White atrophy   Assessment/Plan:  75 y.o. female for annual exam   1. Well female exam with routine gynecological exam Normal gynecologic exam in menopause.  No indication for Pap test at this time.  Breast exam normal.  Screening mammogram November 2021 was negative.  Colonoscopy in 2021.  Body mass index 23.19.  Continue with fitness and healthy nutrition.  Health labs with family physician.  2. Postmenopause Well on no hormone replacement therapy.  No postmenopausal bleeding.  3. Osteopenia of necks of both femurs Stable osteopenia on last bone density June 2021.  Continue with vitamin D, calcium intake of 1.5 g/day and regular weightbearing physical activities.  4. Lichen sclerosus et atrophicus of the vulva Vulvar exam compatible with lichen sclerosis which is a new diagnosis.  Counseling done on lichen sclerosus, management discussed.  Will treat  with clobetasol cream, a thin application on the affected vulvar twice a week.  Usage reviewed and prescription sent to pharmacy.  Other orders - rosuvastatin (CRESTOR) 20 MG tablet; Take 20 mg by mouth daily. - Calcium Citrate-Vitamin D (CALCIUM + D PO); Take by mouth. - clobetasol cream (TEMOVATE) 0.05 %; Apply 1 application topically 2 (two) times a week. Thin application of affected vulva.   Princess Bruins MD, 2:11 PM 06/30/2020

## 2020-08-26 ENCOUNTER — Encounter: Payer: Self-pay | Admitting: Gastroenterology

## 2020-09-03 DIAGNOSIS — E785 Hyperlipidemia, unspecified: Secondary | ICD-10-CM | POA: Diagnosis not present

## 2020-09-03 DIAGNOSIS — E039 Hypothyroidism, unspecified: Secondary | ICD-10-CM | POA: Diagnosis not present

## 2020-09-10 DIAGNOSIS — M8580 Other specified disorders of bone density and structure, unspecified site: Secondary | ICD-10-CM | POA: Diagnosis not present

## 2020-09-10 DIAGNOSIS — Z1389 Encounter for screening for other disorder: Secondary | ICD-10-CM | POA: Diagnosis not present

## 2020-09-10 DIAGNOSIS — Z1331 Encounter for screening for depression: Secondary | ICD-10-CM | POA: Diagnosis not present

## 2020-09-10 DIAGNOSIS — N904 Leukoplakia of vulva: Secondary | ICD-10-CM | POA: Diagnosis not present

## 2020-09-10 DIAGNOSIS — I10 Essential (primary) hypertension: Secondary | ICD-10-CM | POA: Diagnosis not present

## 2020-09-10 DIAGNOSIS — E781 Pure hyperglyceridemia: Secondary | ICD-10-CM | POA: Diagnosis not present

## 2020-09-10 DIAGNOSIS — Z23 Encounter for immunization: Secondary | ICD-10-CM | POA: Diagnosis not present

## 2020-09-10 DIAGNOSIS — E039 Hypothyroidism, unspecified: Secondary | ICD-10-CM | POA: Diagnosis not present

## 2020-09-10 DIAGNOSIS — Z0000 Encounter for general adult medical examination without abnormal findings: Secondary | ICD-10-CM | POA: Diagnosis not present

## 2020-09-10 DIAGNOSIS — E785 Hyperlipidemia, unspecified: Secondary | ICD-10-CM | POA: Diagnosis not present

## 2020-09-10 DIAGNOSIS — M5416 Radiculopathy, lumbar region: Secondary | ICD-10-CM | POA: Diagnosis not present

## 2020-09-10 DIAGNOSIS — R82998 Other abnormal findings in urine: Secondary | ICD-10-CM | POA: Diagnosis not present

## 2020-09-11 ENCOUNTER — Other Ambulatory Visit: Payer: Self-pay

## 2020-09-11 ENCOUNTER — Ambulatory Visit (AMBULATORY_SURGERY_CENTER): Payer: Medicare Other

## 2020-09-11 VITALS — Ht 63.5 in | Wt 127.5 lb

## 2020-09-11 DIAGNOSIS — Z1211 Encounter for screening for malignant neoplasm of colon: Secondary | ICD-10-CM

## 2020-09-11 MED ORDER — PLENVU 140 G PO SOLR: 1.0000 | ORAL | 0 refills | Status: DC

## 2020-09-11 NOTE — Progress Notes (Signed)
  Patient's pre-visit was done today over the phone with the patient   Name,DOB and address verified.   Patient denies any allergies to Eggs and Soy.  Patient denies any problems with anesthesia/sedation. Patient denies taking diet pills or blood thinners.  Denies atrial flutter or atrial fib Denies chronic constipation No home Oxygen.   Packet of Prep instructions mailed to patient including a copy of a consent form-pt is aware.  Patient understands to call us back with any questions or concerns.  Patient is aware of our care-partner policy and Covid-19 safety protocol.   The patient is COVID-19 vaccinated.    

## 2020-09-24 ENCOUNTER — Other Ambulatory Visit: Payer: Self-pay

## 2020-09-24 ENCOUNTER — Encounter: Payer: Self-pay | Admitting: Gastroenterology

## 2020-09-24 ENCOUNTER — Ambulatory Visit (AMBULATORY_SURGERY_CENTER): Payer: Medicare Other | Admitting: Gastroenterology

## 2020-09-24 VITALS — BP 141/66 | HR 63 | Temp 96.6°F | Resp 14 | Ht 63.5 in | Wt 127.5 lb

## 2020-09-24 DIAGNOSIS — D120 Benign neoplasm of cecum: Secondary | ICD-10-CM | POA: Diagnosis not present

## 2020-09-24 DIAGNOSIS — K648 Other hemorrhoids: Secondary | ICD-10-CM | POA: Diagnosis not present

## 2020-09-24 DIAGNOSIS — Z1211 Encounter for screening for malignant neoplasm of colon: Secondary | ICD-10-CM

## 2020-09-24 MED ORDER — SODIUM CHLORIDE 0.9 % IV SOLN: 500.0000 mL | Freq: Once | INTRAVENOUS | Status: DC

## 2020-09-24 NOTE — Progress Notes (Signed)
VS completed by DT.  Pt's states no medical or surgical changes since previsit or office visit.  

## 2020-09-24 NOTE — Patient Instructions (Signed)
Resume previous diet and continue present medications. Awaiting pathology results. No repeat Colonoscopy due to age.  YOU HAD AN ENDOSCOPIC PROCEDURE TODAY AT [facility]:   Refer to the procedure report that was given to you for any specific questions about what was found during the examination.  If the procedure report does not answer your questions, please call your gastroenterologist to clarify.  If you requested that your care partner not be given the details of your procedure findings, then the procedure report has been included in a sealed envelope for you to review at your convenience later.  YOU SHOULD EXPECT: Some feelings of bloating in the abdomen. Passage of more gas than usual.  Walking can help get rid of the air that was put into your GI tract during the procedure and reduce the bloating. If you had a lower endoscopy (such as a colonoscopy or flexible sigmoidoscopy) you may notice spotting of blood in your stool or on the toilet paper. If you underwent a bowel prep for your procedure, you may not have a normal bowel movement for a few days.  Please Note:  You might notice some irritation and congestion in your nose or some drainage.  This is from the oxygen used during your procedure.  There is no need for concern and it should clear up in a day or so.  SYMPTOMS TO REPORT IMMEDIATELY:  Following lower endoscopy (colonoscopy or flexible sigmoidoscopy):  Excessive amounts of blood in the stool  Significant tenderness or worsening of abdominal pains  Swelling of the abdomen that is new, acute  Fever of 100F or higher  For urgent or emergent issues, a gastroenterologist can be reached at any hour by calling [phone]. Do not use MyChart messaging for urgent concerns.    DIET:  We do recommend a small meal at first, but then you may proceed to your regular diet.  Drink plenty of fluids but you should avoid alcoholic beverages for 24 hours.  ACTIVITY:  You  should plan to take it easy for the rest of today and you should NOT DRIVE or use heavy machinery until tomorrow (because of the sedation medicines used during the test).    FOLLOW UP: Our staff will call the number listed on your records 48-72 hours following your procedure to check on you and address any questions or concerns that you may have regarding the information given to you following your procedure. If we do not reach you, we will leave a message.  We will attempt to reach you two times.  During this call, we will ask if you have developed any symptoms of COVID 19. If you develop any symptoms (ie: fever, flu-like symptoms, shortness of breath, cough etc.) before then, please call [phone].  If you test positive for Covid 19 in the 2 weeks post procedure, please call and report this information to [place].    If any biopsies were taken you will be contacted by phone or by letter within the next 1-3 weeks.  Please call us at [phone] if you have not heard about the biopsies in 3 weeks.    SIGNATURES/CONFIDENTIALITY: You and/or your care partner have signed paperwork which will be entered into your electronic medical record.  These signatures attest to the fact that that the information above on your After Visit Summary has been reviewed and is understood.  Full responsibility of the confidentiality of this discharge information lies with you and/or your care-partner.

## 2020-09-24 NOTE — Op Note (Signed)
[facility] Patient Name: Holly Blanchard Procedure Date: 09/24/2020 3:01 PM MRN: [id number] Endoscopist: Mauri Pole , MD Age: 75 Referring MD:  Date of Birth: September 19, 1945 Gender: Female Account #: [id number] Procedure:                Colonoscopy Indications:              High risk colon cancer surveillance: Personal                            history of colonic polyps, High risk colon cancer                            surveillance: Personal history of adenoma less than                            10 mm in size. Last colonoscopy 2011, was normal. Medicines:                Monitored Anesthesia Care Procedure:                Pre-Anesthesia Assessment:                           - Prior to the procedure, a History and Physical                            was performed, and patient medications and                            allergies were reviewed. The patient's tolerance of                            previous anesthesia was also reviewed. The risks                            and benefits of the procedure and the sedation                            options and risks were discussed with the patient.                            All questions were answered, and informed consent                            was obtained. Prior Anticoagulants: The patient has                            taken no previous anticoagulant or antiplatelet                            agents. ASA Grade Assessment: II - A patient with                            mild systemic disease. After reviewing the risks  and benefits, the patient was deemed in                            satisfactory condition to undergo the procedure.                           After obtaining informed consent, the colonoscope                            was passed under direct vision. Throughout the                            procedure, the patient's blood pressure, pulse, and                            oxygen  saturations were monitored continuously. The                            Olympus PCF-H190DL ([id number]) Colonoscope was                            introduced through the anus and advanced to the the                            cecum, identified by appendiceal orifice and                            ileocecal valve. The colonoscopy was performed                            without difficulty. The patient tolerated the                            procedure well. The quality of the bowel                            preparation was excellent. The ileocecal valve,                            appendiceal orifice, and rectum were photographed. Scope In: 3:18:55 PM Scope Out: 3:33:38 PM Scope Withdrawal Time: 0 hours 8 minutes 16 seconds  Total Procedure Duration: 0 hours 14 minutes 43 seconds  Findings:                 The perianal and digital rectal examinations were                            normal.                           A 2 mm polyp was found in the cecum. The polyp was                            sessile. The polyp was removed with a cold biopsy  forceps. Resection and retrieval were complete.                           Non-bleeding external and internal hemorrhoids were                            found during retroflexion. The hemorrhoids were                            medium-sized.                           The exam was otherwise without abnormality. Complications:            No immediate complications. Estimated Blood Loss:     Estimated blood loss was minimal. Impression:               - One 2 mm polyp in the cecum, removed with a cold                            biopsy forceps. Resected and retrieved.                           - Non-bleeding external and internal hemorrhoids.                           - The examination was otherwise normal. Recommendation:           - Patient has a contact number available for                            emergencies. The signs and  symptoms of potential                            delayed complications were discussed with the                            patient. Return to normal activities tomorrow.                            Written discharge instructions were provided to the                            patient.                           - Resume previous diet.                           - Continue present medications.                           - Await pathology results.                           - No repeat colonoscopy due to age. Mauri Pole, MD 09/24/2020 3:37:49 PM This report has been signed electronically.

## 2020-09-24 NOTE — Progress Notes (Signed)
Called to room to assist during endoscopic procedure.  Patient ID and intended procedure confirmed with present staff. Received instructions for my participation in the procedure from the performing physician.  

## 2020-09-24 NOTE — Progress Notes (Signed)
[facility] History and Physical   Primary Care Physician:  Ginger Organ., MD   Reason for Procedure:  History of adenomatous colon polyps  Plan:    Surveillance colonoscopy with possible interventions as needed     HPI: Holly Blanchard is a very pleasant 75 y.o. female here for  colonoscopy. Last colonoscopy in 2011 with no polyps, was normal. Denies any nausea, vomiting, abdominal pain, melena or bright red blood per rectum  The risks and benefits as well as alternatives of endoscopic procedure(s) have been discussed and reviewed. All questions answered. The patient agrees to proceed.    Past Medical History:  Diagnosis Date   Cancer ([facility])    basal cell- left leg removed   Cataract    Hyperlipidemia    Hypertension    Migraines    history of, last one 2008   Osteopenia    Thyroid disease     Past Surgical History:  Procedure Laterality Date   BREAST EXCISIONAL BIOPSY Left 08/17/2001   BREAST SURGERY Left    biospy   COLONOSCOPY     LUMBAR LAMINECTOMY/DECOMPRESSION MICRODISCECTOMY Left 06/21/2012   Procedure: LUMBAR LAMINECTOMY/DECOMPRESSION MICRODISCECTOMY;  Surgeon: Sinclair Ship, MD;  Location: [facility];  Service: Orthopedics;  Laterality: Left;  Left sided lumbar 5-sacrum 1 microdisectomy   NECK SURGERY  1982   right side- lymphnode removed    Prior to Admission medications   Medication Sig Start Date End Date Taking? Authorizing Provider  Calcium Citrate-Vitamin D (CALCIUM + D PO) Take by mouth.   Yes [provider]  clobetasol cream (TEMOVATE) 4.58 % Apply 1 application topically 2 (two) times a week. Thin application of affected vulva. 06/30/20  Yes Princess Bruins, MD  hydrochlorothiazide (HYDRODIURIL) 12.5 MG tablet Take 12.5 mg by mouth daily.   Yes [provider]  Probiotic Product (ALIGN PO) Take by mouth.   Yes [provider]  rosuvastatin (CRESTOR) 20 MG tablet Take 20 mg by mouth daily.  04/12/20  Yes [provider]  SYNTHROID 50 MCG tablet Take 1 tablet by mouth daily. 05/29/16  Yes [provider]  cholecalciferol (VITAMIN D) 1000 UNITS tablet Take 1,000 Units by mouth daily.    [provider]  OVER THE COUNTER MEDICATION Apply 1 drop to eye 2 (two) times daily as needed. For dry eyes. Hydro Tears Patient not taking: No sig reported    [provider]    Current Outpatient Medications  Medication Sig Dispense Refill   Calcium Citrate-Vitamin D (CALCIUM + D PO) Take by mouth.     clobetasol cream (TEMOVATE) 0.99 % Apply 1 application topically 2 (two) times a week. Thin application of affected vulva. 30 g 4   hydrochlorothiazide (HYDRODIURIL) 12.5 MG tablet Take 12.5 mg by mouth daily.     Probiotic Product (ALIGN PO) Take by mouth.     rosuvastatin (CRESTOR) 20 MG tablet Take 20 mg by mouth daily.     SYNTHROID 50 MCG tablet Take 1 tablet by mouth daily.  2   cholecalciferol (VITAMIN D) 1000 UNITS tablet Take 1,000 Units by mouth daily.     OVER THE COUNTER MEDICATION Apply 1 drop to eye 2 (two) times daily as needed. For dry eyes. Hydro Tears (Patient not taking: No sig reported)     Current Facility-Administered Medications  Medication Dose Route Frequency Provider Last Rate Last Admin   0.9 %  sodium chloride infusion  500 mL Intravenous Once Chen Saadeh, Venia Minks, MD  Allergies as of 09/24/2020 - Review Complete 09/24/2020  Allergen Reaction Noted   Amlodipine Swelling 06/25/2019   Biaxin [clarithromycin]  10/06/2006   Fish oil     Olmesartan Diarrhea 06/25/2019   Raloxifene  10/06/2006    Family History  Problem Relation Age of Onset   Hypertension Mother    Breast cancer Mother 41       lump removed only   Colon cancer Maternal Aunt    Cancer Maternal Aunt        colon   Colon polyps Neg Hx    Esophageal cancer Neg Hx    Stomach cancer Neg Hx    Rectal cancer Neg Hx     Social History   Socioeconomic  History   Marital status: Widowed    Spouse name: Not on file   Number of children: Not on file   Years of education: Not on file   Highest education level: Not on file  Occupational History   Not on file  Tobacco Use   Smoking status: Never   Smokeless tobacco: Never  Vaping Use   Vaping Use: Never used  Substance and Sexual Activity   Alcohol use: Yes    Alcohol/week: 7.0 standard drinks    Types: 7 Glasses of wine per week    Comment: wine   Drug use: No   Sexual activity: Not Currently    Birth control/protection: Post-menopausal    Comment: 1ST INTERCOURSE- 28, PARTNERS- 1  Other Topics Concern   Not on file  Social History Narrative   Not on file   Social Determinants of Health   Financial Resource Strain: Not on file  Food Insecurity: Not on file  Transportation Needs: Not on file  Physical Activity: Not on file  Stress: Not on file  Social Connections: Not on file  Intimate Partner Violence: Not on file    Review of Systems:  All other review of systems negative except as mentioned in the HPI.  Physical Exam: Vital signs in last 24 hours: BP (!) 158/70   Pulse 80   Temp (!) 96.6 F (35.9 C) (Temporal)   Ht 5' 3.5" (1.613 m)   Wt 127 lb 8 oz (57.8 kg)   SpO2 100%   BMI 22.23 kg/m     General:   Alert, NAD Lungs:  Clear .   Heart:  Regular rate and rhythm Abdomen:  Soft, nontender and nondistended. Neuro/Psych:  Alert and cooperative. Normal mood and affect. A and O x 3  Reviewed labs, radiology imaging, old records and pertinent past GI work up  Patient is appropriate for planned procedure(s) and anesthesia in an ambulatory setting   K. Denzil Magnuson , MD [phone]

## 2020-09-26 ENCOUNTER — Telehealth: Payer: Self-pay

## 2020-09-26 NOTE — Telephone Encounter (Signed)
  Follow up Call-  Call back number 09/24/2020  Post procedure Call Back phone  # [phone]  Permission to leave phone message Yes  Some recent data might be hidden     Patient questions:  Do you have a fever, pain , or abdominal swelling? No. Pain Score  0 *  Have you tolerated food without any problems? Yes.    Have you been able to return to your normal activities? Yes.    Do you have any questions about your discharge instructions: Diet   No. Medications  No. Follow up visit  No.  Do you have questions or concerns about your Care? No.  Actions: * If pain score is 4 or above: No action needed, pain <4.

## 2020-10-07 ENCOUNTER — Encounter: Payer: Self-pay | Admitting: Gastroenterology

## 2020-11-04 DIAGNOSIS — H0100A Unspecified blepharitis right eye, upper and lower eyelids: Secondary | ICD-10-CM | POA: Diagnosis not present

## 2020-11-04 DIAGNOSIS — H0100B Unspecified blepharitis left eye, upper and lower eyelids: Secondary | ICD-10-CM | POA: Diagnosis not present

## 2020-11-04 DIAGNOSIS — H40013 Open angle with borderline findings, low risk, bilateral: Secondary | ICD-10-CM | POA: Diagnosis not present

## 2020-12-03 DIAGNOSIS — J310 Chronic rhinitis: Secondary | ICD-10-CM | POA: Diagnosis not present

## 2020-12-03 DIAGNOSIS — J342 Deviated nasal septum: Secondary | ICD-10-CM | POA: Diagnosis not present

## 2021-02-20 ENCOUNTER — Other Ambulatory Visit: Payer: Self-pay | Admitting: Internal Medicine

## 2021-02-20 DIAGNOSIS — Z1231 Encounter for screening mammogram for malignant neoplasm of breast: Secondary | ICD-10-CM

## 2021-03-05 ENCOUNTER — Ambulatory Visit: Payer: Medicare Other

## 2021-03-11 ENCOUNTER — Ambulatory Visit
Admission: RE | Admit: 2021-03-11 | Discharge: 2021-03-11 | Disposition: A | Discharge status: Home / Self Care | Payer: Medicare Other | Source: Ambulatory Visit | Attending: Internal Medicine | Admitting: Internal Medicine

## 2021-03-11 DIAGNOSIS — Z1231 Encounter for screening mammogram for malignant neoplasm of breast: Secondary | ICD-10-CM | POA: Diagnosis not present

## 2021-04-16 DIAGNOSIS — L308 Other specified dermatitis: Secondary | ICD-10-CM | POA: Diagnosis not present

## 2021-04-16 DIAGNOSIS — L820 Inflamed seborrheic keratosis: Secondary | ICD-10-CM | POA: Diagnosis not present

## 2021-04-16 DIAGNOSIS — L812 Freckles: Secondary | ICD-10-CM | POA: Diagnosis not present

## 2021-04-16 DIAGNOSIS — Z85828 Personal history of other malignant neoplasm of skin: Secondary | ICD-10-CM | POA: Diagnosis not present

## 2021-04-16 DIAGNOSIS — L821 Other seborrheic keratosis: Secondary | ICD-10-CM | POA: Diagnosis not present

## 2021-04-16 DIAGNOSIS — D1801 Hemangioma of skin and subcutaneous tissue: Secondary | ICD-10-CM | POA: Diagnosis not present

## 2021-04-16 DIAGNOSIS — L218 Other seborrheic dermatitis: Secondary | ICD-10-CM | POA: Diagnosis not present

## 2021-05-04 DIAGNOSIS — H40013 Open angle with borderline findings, low risk, bilateral: Secondary | ICD-10-CM | POA: Diagnosis not present

## 2021-05-04 DIAGNOSIS — H524 Presbyopia: Secondary | ICD-10-CM | POA: Diagnosis not present

## 2021-05-04 DIAGNOSIS — H2513 Age-related nuclear cataract, bilateral: Secondary | ICD-10-CM | POA: Diagnosis not present

## 2021-05-07 DIAGNOSIS — I10 Essential (primary) hypertension: Secondary | ICD-10-CM | POA: Diagnosis not present

## 2021-05-07 DIAGNOSIS — E781 Pure hyperglyceridemia: Secondary | ICD-10-CM | POA: Diagnosis not present

## 2021-05-07 DIAGNOSIS — E785 Hyperlipidemia, unspecified: Secondary | ICD-10-CM | POA: Diagnosis not present

## 2021-05-07 DIAGNOSIS — R519 Headache, unspecified: Secondary | ICD-10-CM | POA: Diagnosis not present

## 2021-05-15 DIAGNOSIS — E785 Hyperlipidemia, unspecified: Secondary | ICD-10-CM | POA: Diagnosis not present

## 2021-05-15 DIAGNOSIS — E871 Hypo-osmolality and hyponatremia: Secondary | ICD-10-CM | POA: Diagnosis not present

## 2021-05-15 DIAGNOSIS — I10 Essential (primary) hypertension: Secondary | ICD-10-CM | POA: Diagnosis not present

## 2021-05-28 DIAGNOSIS — I10 Essential (primary) hypertension: Secondary | ICD-10-CM | POA: Diagnosis not present

## 2021-05-28 DIAGNOSIS — E785 Hyperlipidemia, unspecified: Secondary | ICD-10-CM | POA: Diagnosis not present

## 2021-05-28 DIAGNOSIS — E781 Pure hyperglyceridemia: Secondary | ICD-10-CM | POA: Diagnosis not present

## 2021-05-28 DIAGNOSIS — R519 Headache, unspecified: Secondary | ICD-10-CM | POA: Diagnosis not present

## 2021-06-04 DIAGNOSIS — H25812 Combined forms of age-related cataract, left eye: Secondary | ICD-10-CM | POA: Diagnosis not present

## 2021-06-04 DIAGNOSIS — H2512 Age-related nuclear cataract, left eye: Secondary | ICD-10-CM | POA: Diagnosis not present

## 2021-06-04 DIAGNOSIS — H269 Unspecified cataract: Secondary | ICD-10-CM | POA: Diagnosis not present

## 2021-06-04 DIAGNOSIS — H25012 Cortical age-related cataract, left eye: Secondary | ICD-10-CM | POA: Diagnosis not present

## 2021-06-04 DIAGNOSIS — H52222 Regular astigmatism, left eye: Secondary | ICD-10-CM | POA: Diagnosis not present

## 2021-06-18 DIAGNOSIS — H52221 Regular astigmatism, right eye: Secondary | ICD-10-CM | POA: Diagnosis not present

## 2021-06-18 DIAGNOSIS — H2511 Age-related nuclear cataract, right eye: Secondary | ICD-10-CM | POA: Diagnosis not present

## 2021-06-18 DIAGNOSIS — H25811 Combined forms of age-related cataract, right eye: Secondary | ICD-10-CM | POA: Diagnosis not present

## 2021-06-18 DIAGNOSIS — H25011 Cortical age-related cataract, right eye: Secondary | ICD-10-CM | POA: Diagnosis not present

## 2021-07-02 ENCOUNTER — Ambulatory Visit (INDEPENDENT_AMBULATORY_CARE_PROVIDER_SITE_OTHER): Payer: Medicare Other | Admitting: Obstetrics & Gynecology

## 2021-07-02 ENCOUNTER — Encounter: Payer: Self-pay | Admitting: Obstetrics & Gynecology

## 2021-07-02 VITALS — BP 140/80 | HR 64 | Ht 63.75 in | Wt 130.0 lb

## 2021-07-02 DIAGNOSIS — M85852 Other specified disorders of bone density and structure, left thigh: Secondary | ICD-10-CM

## 2021-07-02 DIAGNOSIS — Z780 Asymptomatic menopausal state: Secondary | ICD-10-CM | POA: Diagnosis not present

## 2021-07-02 DIAGNOSIS — M85851 Other specified disorders of bone density and structure, right thigh: Secondary | ICD-10-CM | POA: Diagnosis not present

## 2021-07-02 DIAGNOSIS — Z01419 Encounter for gynecological examination (general) (routine) without abnormal findings: Secondary | ICD-10-CM

## 2021-07-02 DIAGNOSIS — N904 Leukoplakia of vulva: Secondary | ICD-10-CM | POA: Diagnosis not present

## 2021-07-02 NOTE — Progress Notes (Signed)
Holly Blanchard 08/03/1945 [id number]   History:    76 y.o. G2P1A1L1 Widowed.   RP:  Established patient presenting for annual gyn exam    HPI: Postmenopausal, well on no HRT.  No PMB. No significant hot flashes or night sweats.  No Pelvic pain.  Lichen sclerosus well controled on  betamethasone dipropionate 0.05 % cream twice a week.  Abstinent.  No h/o abnormal Pap.  Last Pap 02/2010 Neg.  No indication to repeat a Pap at this time.  Breasts normal. Mammo Neg 03/2021.  BMI 22.49. Colonoscopy 09/2020.  Osteopenia.  DEXA 06/2019 stable with T score -1.6.  Scheduled to repeat a BD here. Supplementing with vitamin D and calcium.  Health labs with primary care doctor.   Past medical history,surgical history, family history and social history were all reviewed and documented in the EPIC chart.  Gynecologic History No LMP recorded. Patient is postmenopausal.  Obstetric History OB History  Gravida Para Term Preterm AB Living  '2 1 1   1 1  '$ SAB IAB Ectopic Multiple Live Births  1       1    # Outcome Date GA Lbr Len/2nd Weight Sex Delivery Anes PTL Lv  2 SAB           1 Term              ROS: A ROS was performed and pertinent positives and negatives are included in the history. GENERAL: No fevers or chills. HEENT: No change in vision, no earache, sore throat or sinus congestion. NECK: No pain or stiffness. CARDIOVASCULAR: No chest pain or pressure. No palpitations. PULMONARY: No shortness of breath, cough or wheeze. GASTROINTESTINAL: No abdominal pain, nausea, vomiting or diarrhea, melena or bright red blood per rectum. GENITOURINARY: No urinary frequency, urgency, hesitancy or dysuria. MUSCULOSKELETAL: No joint or muscle pain, no back pain, no recent trauma. DERMATOLOGIC: No rash, no itching, no lesions. ENDOCRINE: No polyuria, polydipsia, no heat or cold intolerance. No recent change in weight. HEMATOLOGICAL: No anemia or easy bruising or bleeding. NEUROLOGIC: No headache, seizures,  numbness, tingling or weakness. PSYCHIATRIC: No depression, no loss of interest in normal activity or change in sleep pattern.     Exam:   BP 140/80   Pulse 64   Ht 5' 3.75" (1.619 m)   Wt 130 lb (59 kg)   SpO2 98%   BMI 22.49 kg/m   Body mass index is 22.49 kg/m.  General appearance : Well developed well nourished female. No acute distress HEENT: Eyes: no retinal hemorrhage or exudates,  Neck supple, trachea midline, no carotid bruits, no thyroidmegaly Lungs: Clear to auscultation, no rhonchi or wheezes, or rib retractions  Heart: Regular rate and rhythm, no murmurs or gallops Breast:Examined in sitting and supine position were symmetrical in appearance, no palpable masses or tenderness,  no skin retraction, no nipple inversion, no nipple discharge, no skin discoloration, no axillary or supraclavicular lymphadenopathy Abdomen: no palpable masses or tenderness, no rebound or guarding Extremities: no edema or skin discoloration or tenderness  Pelvic: Vulva: White atrophy of Lichen sclerosus much improved             Vagina: No gross lesions or discharge  Cervix: No gross lesions or discharge  Uterus  AV, normal size, shape and consistency, non-tender and mobile  Adnexa  Without masses or tenderness  Anus: Normal   Assessment/Plan:  76 y.o. female for annual exam   1. Well female exam with routine gynecological exam  Postmenopausal, well on no HRT.  No PMB. No significant hot flashes or night sweats.  No Pelvic pain.  Lichen sclerosus well controled on betamethasone dipropionate 0.05 % cream twice a week.  Abstinent.  No h/o abnormal Pap.  Last Pap 02/2010 Neg.  No indication to repeat a Pap at this time.  Breasts normal. Mammo Neg 03/2021.  BMI 22.49. Colonoscopy 09/2020.  Osteopenia.  DEXA 06/2019 stable with T score -1.6.  Scheduled to repeat a BD here. Supplementing with vitamin D and calcium.  Health labs with primary care doctor.  2. Postmenopause Postmenopausal, well on no HRT.   No PMB. No significant hot flashes or night sweats.  No Pelvic pain.   3. Osteopenia of necks of both femurs steopenia.  DEXA 06/2019 stable with T score -1.6.  Scheduled to repeat a BD here. Supplementing with vitamin D and calcium.  Continue with regular walking.  4. Lichen sclerosus et atrophicus of the vulva  Lichen sclerosus well controled on betamethasone dipropionate 0.05 % cream twice a week.   Other orders - hydrochlorothiazide (HYDRODIURIL) 25 MG tablet; Take 25 mg by mouth every morning. - betamethasone dipropionate 0.05 % cream; Apply 1 Application topically 2 (two) times daily. - hydrocortisone 2.5 % cream; Apply 1 Application topically 2 (two) times daily. - ketorolac (ACULAR) 0.5 % ophthalmic solution; Place 1 drop into the right eye 4 (four) times daily. - moxifloxacin (VIGAMOX) 0.5 % ophthalmic solution; Place into the right eye. - prednisoLONE acetate (PRED FORTE) 1 % ophthalmic suspension; 1 drop 4 (four) times daily. - rosuvastatin (CRESTOR) 10 MG tablet; SMARTSIG:1 By Mouth   Princess Bruins MD, 11:07 AM 07/02/2021

## 2021-07-03 ENCOUNTER — Other Ambulatory Visit: Payer: Self-pay | Admitting: *Deleted

## 2021-07-03 DIAGNOSIS — M85851 Other specified disorders of bone density and structure, right thigh: Secondary | ICD-10-CM

## 2021-07-08 ENCOUNTER — Other Ambulatory Visit: Payer: Self-pay | Admitting: Obstetrics & Gynecology

## 2021-07-08 ENCOUNTER — Ambulatory Visit (INDEPENDENT_AMBULATORY_CARE_PROVIDER_SITE_OTHER): Payer: Medicare Other

## 2021-07-08 DIAGNOSIS — Z780 Asymptomatic menopausal state: Secondary | ICD-10-CM

## 2021-07-08 DIAGNOSIS — M8589 Other specified disorders of bone density and structure, multiple sites: Secondary | ICD-10-CM

## 2021-07-08 DIAGNOSIS — Z13820 Encounter for screening for osteoporosis: Secondary | ICD-10-CM | POA: Diagnosis not present

## 2021-07-08 DIAGNOSIS — M85851 Other specified disorders of bone density and structure, right thigh: Secondary | ICD-10-CM

## 2021-07-22 DIAGNOSIS — H16203 Unspecified keratoconjunctivitis, bilateral: Secondary | ICD-10-CM | POA: Diagnosis not present

## 2021-09-15 DIAGNOSIS — E785 Hyperlipidemia, unspecified: Secondary | ICD-10-CM | POA: Diagnosis not present

## 2021-09-15 DIAGNOSIS — I10 Essential (primary) hypertension: Secondary | ICD-10-CM | POA: Diagnosis not present

## 2021-09-15 DIAGNOSIS — R7989 Other specified abnormal findings of blood chemistry: Secondary | ICD-10-CM | POA: Diagnosis not present

## 2021-09-15 DIAGNOSIS — N1831 Chronic kidney disease, stage 3a: Secondary | ICD-10-CM | POA: Diagnosis not present

## 2021-09-15 DIAGNOSIS — M8580 Other specified disorders of bone density and structure, unspecified site: Secondary | ICD-10-CM | POA: Diagnosis not present

## 2021-09-15 DIAGNOSIS — E559 Vitamin D deficiency, unspecified: Secondary | ICD-10-CM | POA: Diagnosis not present

## 2021-09-15 DIAGNOSIS — E039 Hypothyroidism, unspecified: Secondary | ICD-10-CM | POA: Diagnosis not present

## 2021-09-22 DIAGNOSIS — M8580 Other specified disorders of bone density and structure, unspecified site: Secondary | ICD-10-CM | POA: Diagnosis not present

## 2021-09-22 DIAGNOSIS — Z1331 Encounter for screening for depression: Secondary | ICD-10-CM | POA: Diagnosis not present

## 2021-09-22 DIAGNOSIS — Z1339 Encounter for screening examination for other mental health and behavioral disorders: Secondary | ICD-10-CM | POA: Diagnosis not present

## 2021-09-22 DIAGNOSIS — Z86010 Personal history of colonic polyps: Secondary | ICD-10-CM | POA: Diagnosis not present

## 2021-09-22 DIAGNOSIS — E039 Hypothyroidism, unspecified: Secondary | ICD-10-CM | POA: Diagnosis not present

## 2021-09-22 DIAGNOSIS — E785 Hyperlipidemia, unspecified: Secondary | ICD-10-CM | POA: Diagnosis not present

## 2021-09-22 DIAGNOSIS — I10 Essential (primary) hypertension: Secondary | ICD-10-CM | POA: Diagnosis not present

## 2021-09-22 DIAGNOSIS — D692 Other nonthrombocytopenic purpura: Secondary | ICD-10-CM | POA: Diagnosis not present

## 2021-09-22 DIAGNOSIS — R82998 Other abnormal findings in urine: Secondary | ICD-10-CM | POA: Diagnosis not present

## 2021-09-22 DIAGNOSIS — Z0000 Encounter for general adult medical examination without abnormal findings: Secondary | ICD-10-CM | POA: Diagnosis not present

## 2021-11-11 DIAGNOSIS — R0981 Nasal congestion: Secondary | ICD-10-CM | POA: Diagnosis not present

## 2021-11-11 DIAGNOSIS — R5081 Fever presenting with conditions classified elsewhere: Secondary | ICD-10-CM | POA: Diagnosis not present

## 2021-11-11 DIAGNOSIS — I10 Essential (primary) hypertension: Secondary | ICD-10-CM | POA: Diagnosis not present

## 2021-11-11 DIAGNOSIS — R051 Acute cough: Secondary | ICD-10-CM | POA: Diagnosis not present

## 2021-11-11 DIAGNOSIS — R5383 Other fatigue: Secondary | ICD-10-CM | POA: Diagnosis not present

## 2021-11-11 DIAGNOSIS — Z1152 Encounter for screening for COVID-19: Secondary | ICD-10-CM | POA: Diagnosis not present

## 2022-02-02 DIAGNOSIS — H40013 Open angle with borderline findings, low risk, bilateral: Secondary | ICD-10-CM | POA: Diagnosis not present

## 2022-05-05 DIAGNOSIS — L308 Other specified dermatitis: Secondary | ICD-10-CM | POA: Diagnosis not present

## 2022-05-05 DIAGNOSIS — L820 Inflamed seborrheic keratosis: Secondary | ICD-10-CM | POA: Diagnosis not present

## 2022-05-05 DIAGNOSIS — D1801 Hemangioma of skin and subcutaneous tissue: Secondary | ICD-10-CM | POA: Diagnosis not present

## 2022-05-05 DIAGNOSIS — B078 Other viral warts: Secondary | ICD-10-CM | POA: Diagnosis not present

## 2022-05-05 DIAGNOSIS — L812 Freckles: Secondary | ICD-10-CM | POA: Diagnosis not present

## 2022-05-05 DIAGNOSIS — L2089 Other atopic dermatitis: Secondary | ICD-10-CM | POA: Diagnosis not present

## 2022-05-05 DIAGNOSIS — Z85828 Personal history of other malignant neoplasm of skin: Secondary | ICD-10-CM | POA: Diagnosis not present

## 2022-05-05 DIAGNOSIS — L821 Other seborrheic keratosis: Secondary | ICD-10-CM | POA: Diagnosis not present

## 2022-05-05 DIAGNOSIS — L218 Other seborrheic dermatitis: Secondary | ICD-10-CM | POA: Diagnosis not present

## 2022-05-21 DIAGNOSIS — H02822 Cysts of right lower eyelid: Secondary | ICD-10-CM | POA: Diagnosis not present

## 2022-05-21 DIAGNOSIS — H0100B Unspecified blepharitis left eye, upper and lower eyelids: Secondary | ICD-10-CM | POA: Diagnosis not present

## 2022-05-21 DIAGNOSIS — H02825 Cysts of left lower eyelid: Secondary | ICD-10-CM | POA: Diagnosis not present

## 2022-05-21 DIAGNOSIS — H0100A Unspecified blepharitis right eye, upper and lower eyelids: Secondary | ICD-10-CM | POA: Diagnosis not present

## 2022-05-25 ENCOUNTER — Other Ambulatory Visit: Payer: Self-pay | Admitting: Internal Medicine

## 2022-05-25 DIAGNOSIS — Z1231 Encounter for screening mammogram for malignant neoplasm of breast: Secondary | ICD-10-CM

## 2022-06-01 ENCOUNTER — Ambulatory Visit
Admission: RE | Admit: 2022-06-01 | Discharge: 2022-06-01 | Disposition: A | Discharge status: Home / Self Care | Payer: Medicare Other | Source: Ambulatory Visit | Attending: Internal Medicine | Admitting: Internal Medicine

## 2022-06-01 DIAGNOSIS — Z1231 Encounter for screening mammogram for malignant neoplasm of breast: Secondary | ICD-10-CM

## 2022-08-03 DIAGNOSIS — H02826 Cysts of left eye, unspecified eyelid: Secondary | ICD-10-CM | POA: Diagnosis not present

## 2022-08-03 DIAGNOSIS — Z961 Presence of intraocular lens: Secondary | ICD-10-CM | POA: Diagnosis not present

## 2022-08-03 DIAGNOSIS — H0100A Unspecified blepharitis right eye, upper and lower eyelids: Secondary | ICD-10-CM | POA: Diagnosis not present

## 2022-08-03 DIAGNOSIS — H40013 Open angle with borderline findings, low risk, bilateral: Secondary | ICD-10-CM | POA: Diagnosis not present

## 2022-08-03 DIAGNOSIS — H0100B Unspecified blepharitis left eye, upper and lower eyelids: Secondary | ICD-10-CM | POA: Diagnosis not present

## 2022-08-03 DIAGNOSIS — H02823 Cysts of right eye, unspecified eyelid: Secondary | ICD-10-CM | POA: Diagnosis not present

## 2022-08-04 DIAGNOSIS — L2089 Other atopic dermatitis: Secondary | ICD-10-CM | POA: Diagnosis not present

## 2022-08-04 DIAGNOSIS — L308 Other specified dermatitis: Secondary | ICD-10-CM | POA: Diagnosis not present

## 2022-08-04 DIAGNOSIS — Z85828 Personal history of other malignant neoplasm of skin: Secondary | ICD-10-CM | POA: Diagnosis not present

## 2022-08-04 DIAGNOSIS — L820 Inflamed seborrheic keratosis: Secondary | ICD-10-CM | POA: Diagnosis not present

## 2022-09-03 IMAGING — MG DIGITAL SCREENING BILAT W/ TOMO W/ CAD
8 series · 9 of 24 positions shown · non-contrast
Comparison: Previous exam(s).

CLINICAL DATA: Screening.

EXAM:
DIGITAL SCREENING BILATERAL MAMMOGRAM WITH TOMO AND CAD

[R MLO synth-2D]
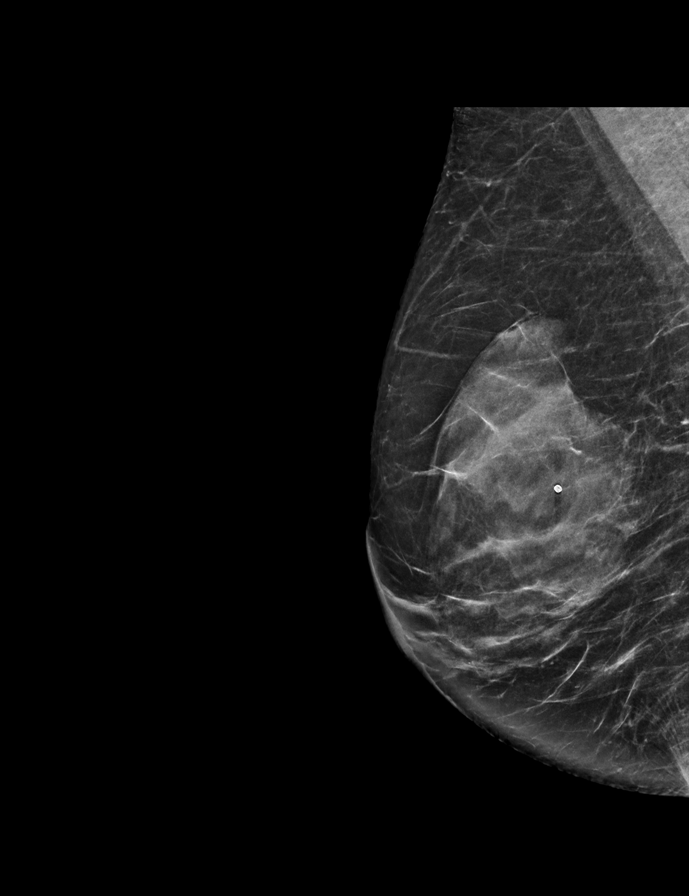

[L MLO synth-2D]
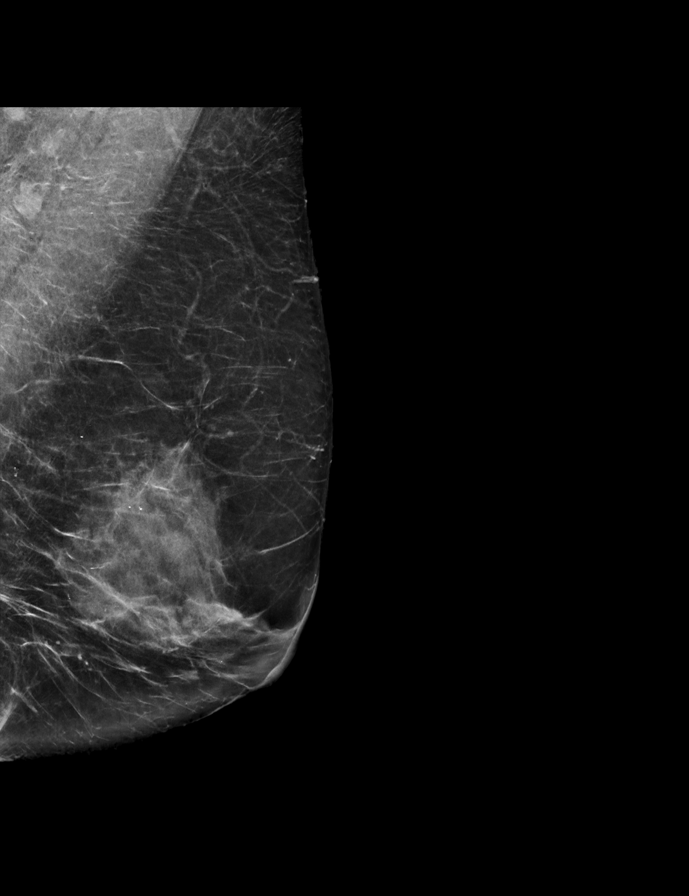

[L CC synth-2D]
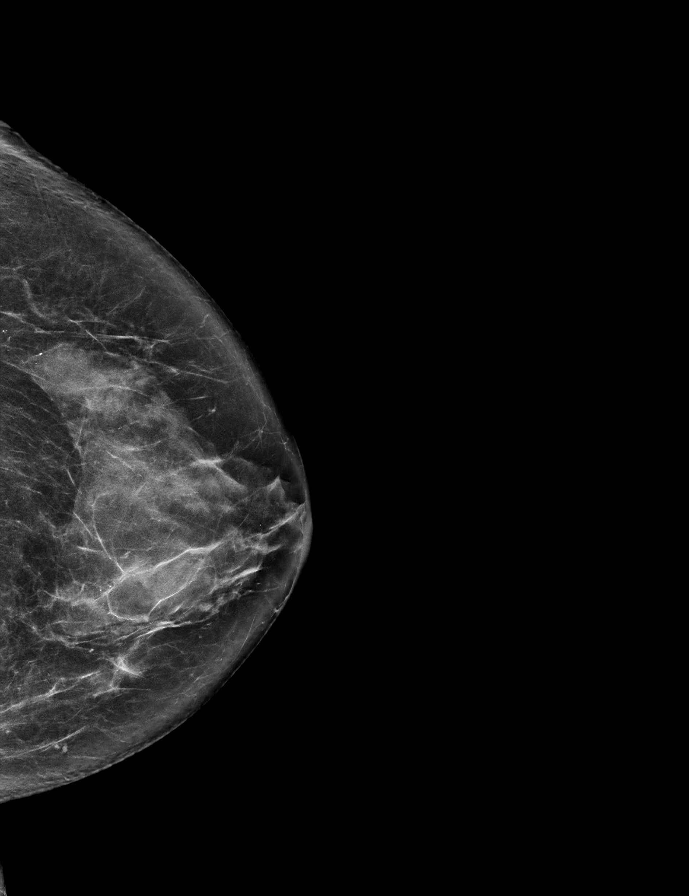

[R CC synth-2D]
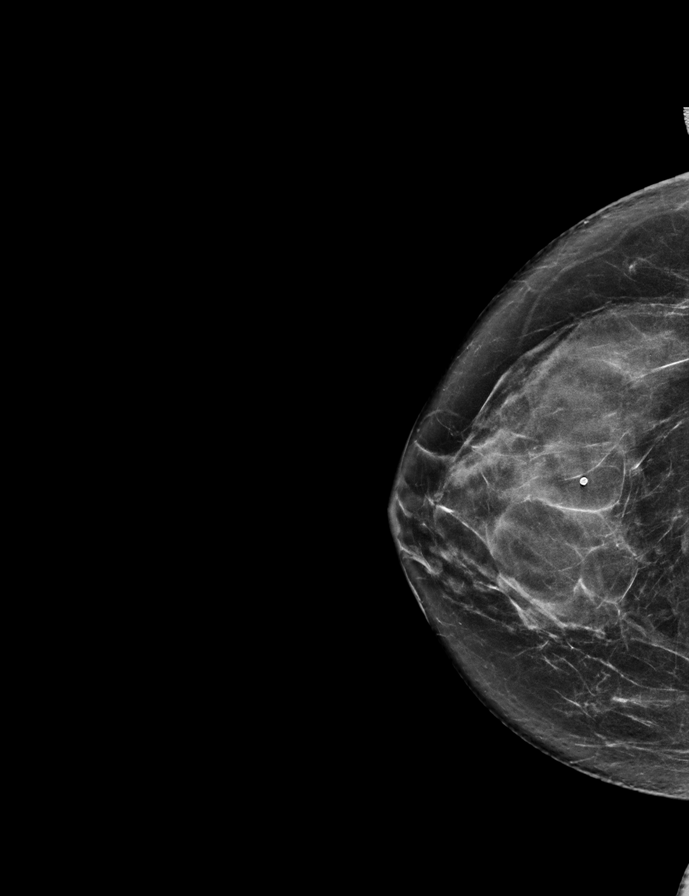

[R CC tomo · 2 of 64 frames shown]
[frame 21/64]
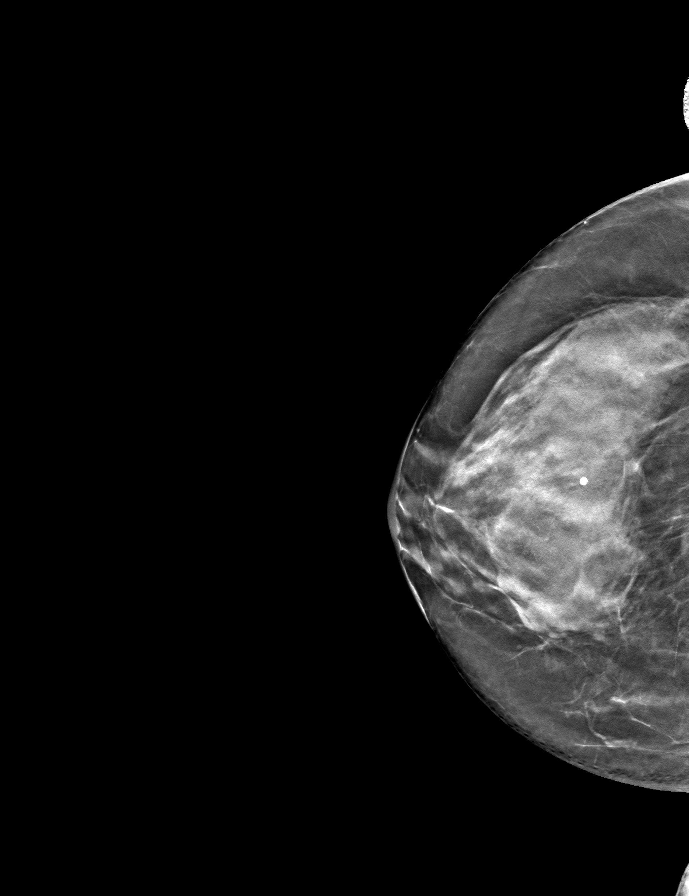
[frame 33/64]
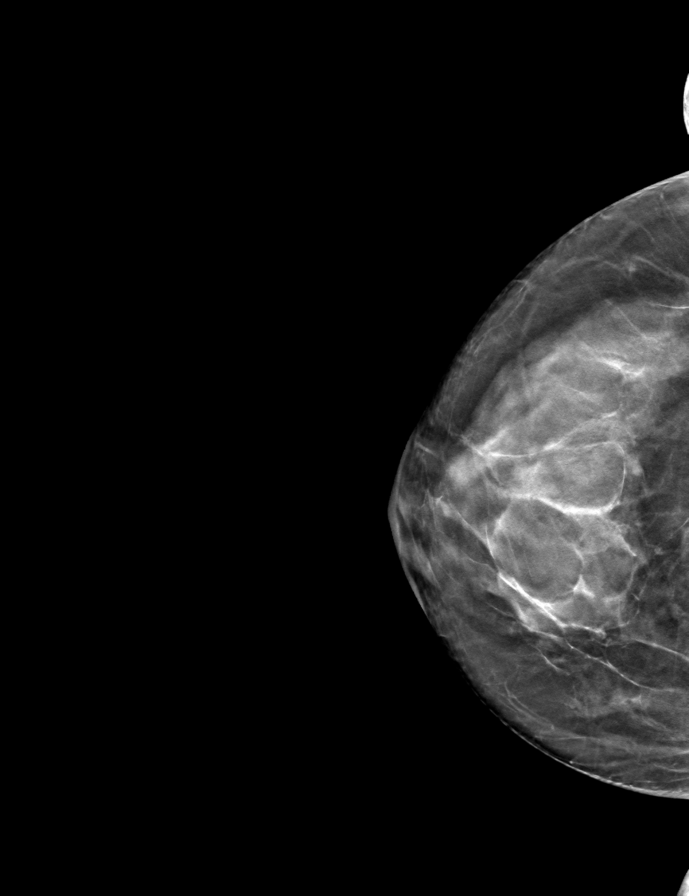

[R MLO tomo · tomo slice 31/62.0]
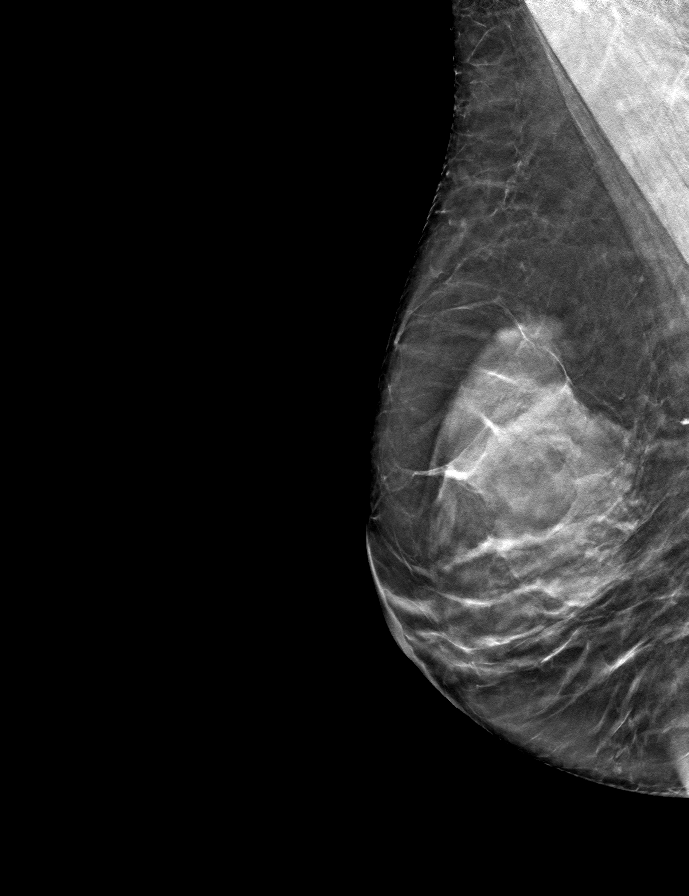

[L MLO tomo · tomo slice 35/69.0]
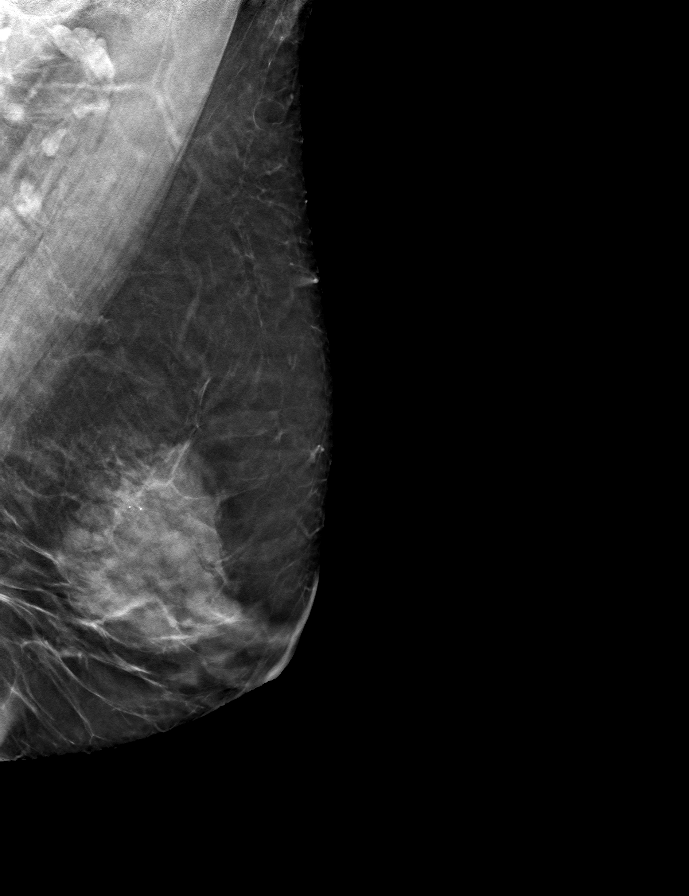

[L CC tomo · tomo slice 33/65.0]
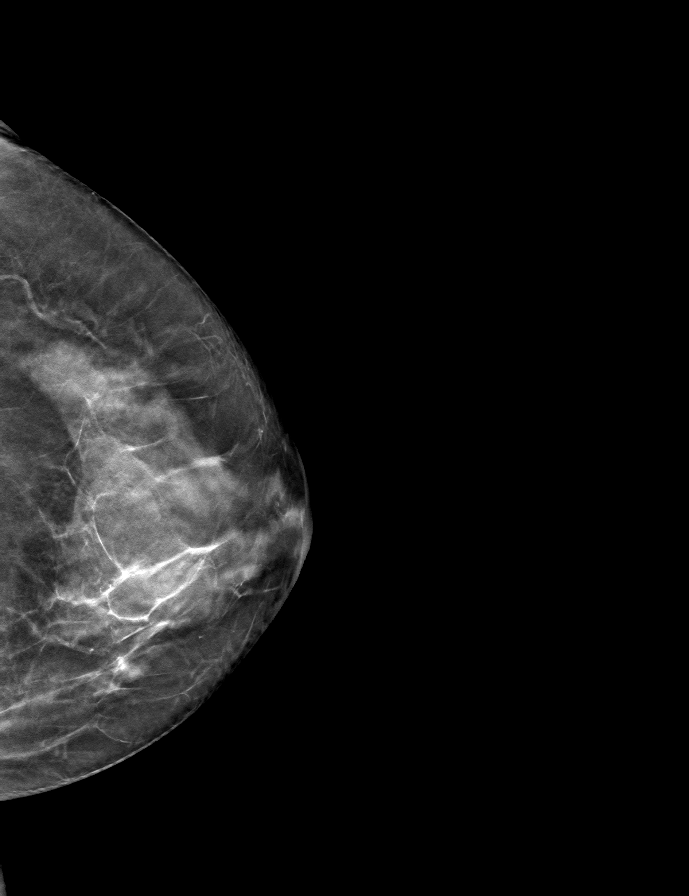

[9 of 24 positions shown; findings below may reference images not displayed]

ACR Breast Density Category c: The breast tissue is heterogeneously
dense, which may obscure small masses.
FINDINGS: There are no findings suspicious for malignancy. Images were
processed with CAD.
IMPRESSION: No mammographic evidence of malignancy. A result letter of this
screening mammogram will be mailed directly to the patient.

RECOMMENDATION:
Screening mammogram in one year. (Code:FT-[id number])

BI-RADS CATEGORY  1: Negative.

## 2022-09-07 DIAGNOSIS — L2089 Other atopic dermatitis: Secondary | ICD-10-CM | POA: Diagnosis not present

## 2022-09-28 DIAGNOSIS — Z1389 Encounter for screening for other disorder: Secondary | ICD-10-CM | POA: Diagnosis not present

## 2022-09-28 DIAGNOSIS — I10 Essential (primary) hypertension: Secondary | ICD-10-CM | POA: Diagnosis not present

## 2022-09-28 DIAGNOSIS — E039 Hypothyroidism, unspecified: Secondary | ICD-10-CM | POA: Diagnosis not present

## 2022-09-28 DIAGNOSIS — Z79899 Other long term (current) drug therapy: Secondary | ICD-10-CM | POA: Diagnosis not present

## 2022-09-28 DIAGNOSIS — E7849 Other hyperlipidemia: Secondary | ICD-10-CM | POA: Diagnosis not present

## 2022-09-28 DIAGNOSIS — E785 Hyperlipidemia, unspecified: Secondary | ICD-10-CM | POA: Diagnosis not present

## 2022-10-05 DIAGNOSIS — E781 Pure hyperglyceridemia: Secondary | ICD-10-CM | POA: Diagnosis not present

## 2022-10-05 DIAGNOSIS — E785 Hyperlipidemia, unspecified: Secondary | ICD-10-CM | POA: Diagnosis not present

## 2022-10-05 DIAGNOSIS — M8580 Other specified disorders of bone density and structure, unspecified site: Secondary | ICD-10-CM | POA: Diagnosis not present

## 2022-10-05 DIAGNOSIS — D692 Other nonthrombocytopenic purpura: Secondary | ICD-10-CM | POA: Diagnosis not present

## 2022-10-05 DIAGNOSIS — L209 Atopic dermatitis, unspecified: Secondary | ICD-10-CM | POA: Diagnosis not present

## 2022-10-05 DIAGNOSIS — E039 Hypothyroidism, unspecified: Secondary | ICD-10-CM | POA: Diagnosis not present

## 2022-10-05 DIAGNOSIS — Z1331 Encounter for screening for depression: Secondary | ICD-10-CM | POA: Diagnosis not present

## 2022-10-05 DIAGNOSIS — Z0000 Encounter for general adult medical examination without abnormal findings: Secondary | ICD-10-CM | POA: Diagnosis not present

## 2022-10-05 DIAGNOSIS — I10 Essential (primary) hypertension: Secondary | ICD-10-CM | POA: Diagnosis not present

## 2022-10-05 DIAGNOSIS — Z23 Encounter for immunization: Secondary | ICD-10-CM | POA: Diagnosis not present

## 2022-10-05 DIAGNOSIS — R82998 Other abnormal findings in urine: Secondary | ICD-10-CM | POA: Diagnosis not present

## 2022-10-05 DIAGNOSIS — Z1339 Encounter for screening examination for other mental health and behavioral disorders: Secondary | ICD-10-CM | POA: Diagnosis not present

## 2022-10-05 DIAGNOSIS — G720 Drug-induced myopathy: Secondary | ICD-10-CM | POA: Diagnosis not present

## 2022-12-06 DIAGNOSIS — L308 Other specified dermatitis: Secondary | ICD-10-CM | POA: Diagnosis not present

## 2022-12-06 DIAGNOSIS — L2089 Other atopic dermatitis: Secondary | ICD-10-CM | POA: Diagnosis not present

## 2022-12-06 DIAGNOSIS — L820 Inflamed seborrheic keratosis: Secondary | ICD-10-CM | POA: Diagnosis not present

## 2022-12-06 DIAGNOSIS — L309 Dermatitis, unspecified: Secondary | ICD-10-CM | POA: Diagnosis not present

## 2022-12-06 DIAGNOSIS — L570 Actinic keratosis: Secondary | ICD-10-CM | POA: Diagnosis not present

## 2022-12-06 DIAGNOSIS — D485 Neoplasm of uncertain behavior of skin: Secondary | ICD-10-CM | POA: Diagnosis not present

## 2022-12-06 DIAGNOSIS — Z79899 Other long term (current) drug therapy: Secondary | ICD-10-CM | POA: Diagnosis not present

## 2022-12-06 DIAGNOSIS — Z85828 Personal history of other malignant neoplasm of skin: Secondary | ICD-10-CM | POA: Diagnosis not present

## 2023-01-06 DIAGNOSIS — Z79899 Other long term (current) drug therapy: Secondary | ICD-10-CM | POA: Diagnosis not present

## 2023-01-06 DIAGNOSIS — Z85828 Personal history of other malignant neoplasm of skin: Secondary | ICD-10-CM | POA: Diagnosis not present

## 2023-01-06 DIAGNOSIS — L2089 Other atopic dermatitis: Secondary | ICD-10-CM | POA: Diagnosis not present

## 2023-02-09 DIAGNOSIS — J3489 Other specified disorders of nose and nasal sinuses: Secondary | ICD-10-CM | POA: Diagnosis not present

## 2023-02-09 DIAGNOSIS — R059 Cough, unspecified: Secondary | ICD-10-CM | POA: Diagnosis not present

## 2023-02-09 DIAGNOSIS — Z79620 Long term (current) use of immunosuppressive biologic: Secondary | ICD-10-CM | POA: Diagnosis not present

## 2023-02-09 DIAGNOSIS — Z1152 Encounter for screening for COVID-19: Secondary | ICD-10-CM | POA: Diagnosis not present

## 2023-02-09 DIAGNOSIS — J029 Acute pharyngitis, unspecified: Secondary | ICD-10-CM | POA: Diagnosis not present

## 2023-02-09 DIAGNOSIS — J101 Influenza due to other identified influenza virus with other respiratory manifestations: Secondary | ICD-10-CM | POA: Diagnosis not present

## 2023-02-09 DIAGNOSIS — R5383 Other fatigue: Secondary | ICD-10-CM | POA: Diagnosis not present

## 2023-05-25 DIAGNOSIS — L2089 Other atopic dermatitis: Secondary | ICD-10-CM | POA: Diagnosis not present

## 2023-05-25 DIAGNOSIS — Z79899 Other long term (current) drug therapy: Secondary | ICD-10-CM | POA: Diagnosis not present

## 2023-05-25 DIAGNOSIS — L578 Other skin changes due to chronic exposure to nonionizing radiation: Secondary | ICD-10-CM | POA: Diagnosis not present

## 2023-05-25 DIAGNOSIS — D1801 Hemangioma of skin and subcutaneous tissue: Secondary | ICD-10-CM | POA: Diagnosis not present

## 2023-05-25 DIAGNOSIS — Z85828 Personal history of other malignant neoplasm of skin: Secondary | ICD-10-CM | POA: Diagnosis not present

## 2023-05-25 DIAGNOSIS — L570 Actinic keratosis: Secondary | ICD-10-CM | POA: Diagnosis not present

## 2023-05-25 DIAGNOSIS — L601 Onycholysis: Secondary | ICD-10-CM | POA: Diagnosis not present

## 2023-06-09 ENCOUNTER — Other Ambulatory Visit: Payer: Self-pay | Admitting: Obstetrics and Gynecology

## 2023-06-09 DIAGNOSIS — Z1231 Encounter for screening mammogram for malignant neoplasm of breast: Secondary | ICD-10-CM

## 2023-06-14 ENCOUNTER — Ambulatory Visit
Admission: RE | Admit: 2023-06-14 | Discharge: 2023-06-14 | Disposition: A | Discharge status: Home / Self Care | Source: Ambulatory Visit | Attending: Obstetrics and Gynecology | Admitting: Obstetrics and Gynecology

## 2023-06-14 DIAGNOSIS — Z1231 Encounter for screening mammogram for malignant neoplasm of breast: Secondary | ICD-10-CM | POA: Diagnosis not present

## 2023-06-16 ENCOUNTER — Ambulatory Visit: Payer: Self-pay | Admitting: Obstetrics and Gynecology

## 2023-06-21 DIAGNOSIS — Z85828 Personal history of other malignant neoplasm of skin: Secondary | ICD-10-CM | POA: Diagnosis not present

## 2023-06-21 DIAGNOSIS — L738 Other specified follicular disorders: Secondary | ICD-10-CM | POA: Diagnosis not present

## 2023-06-21 DIAGNOSIS — Z79899 Other long term (current) drug therapy: Secondary | ICD-10-CM | POA: Diagnosis not present

## 2023-06-21 DIAGNOSIS — L739 Follicular disorder, unspecified: Secondary | ICD-10-CM | POA: Diagnosis not present

## 2023-07-04 ENCOUNTER — Encounter: Payer: Self-pay | Admitting: Obstetrics and Gynecology

## 2023-07-04 ENCOUNTER — Ambulatory Visit (INDEPENDENT_AMBULATORY_CARE_PROVIDER_SITE_OTHER): Payer: Medicare Other | Admitting: Obstetrics and Gynecology

## 2023-07-04 VITALS — BP 150/64 | HR 60 | Temp 98.0°F | Ht 64.5 in | Wt 133.0 lb

## 2023-07-04 DIAGNOSIS — M85851 Other specified disorders of bone density and structure, right thigh: Secondary | ICD-10-CM

## 2023-07-04 DIAGNOSIS — M85852 Other specified disorders of bone density and structure, left thigh: Secondary | ICD-10-CM | POA: Diagnosis not present

## 2023-07-04 DIAGNOSIS — L900 Lichen sclerosus et atrophicus: Secondary | ICD-10-CM | POA: Diagnosis not present

## 2023-07-04 DIAGNOSIS — Z01419 Encounter for gynecological examination (general) (routine) without abnormal findings: Secondary | ICD-10-CM | POA: Insufficient documentation

## 2023-07-04 DIAGNOSIS — Z1331 Encounter for screening for depression: Secondary | ICD-10-CM | POA: Diagnosis not present

## 2023-07-04 MED ORDER — CLOBETASOL PROPIONATE 0.05 % EX CREA: 1.0000 | TOPICAL_CREAM | CUTANEOUS | 4 refills | Status: AC

## 2023-07-04 NOTE — Progress Notes (Signed)
 78 y.o. G2P1011 postmenopausal female with lichen sclerosus here for annual exam. Widowed.  Retired Public librarian.  Moved [place]  10 years ago, originally from [place].  She reports no concerns today.  Working on hypertensive treatment with PCP.  Postmenopausal bleeding: none Pelvic discharge or pain: None Breast mass, nipple discharge or skin changes : None Sexually active: No   Last PAP: 03/02/2010  No h/o abnormal Pap  Last mammogram: 06/14/23 Bi-Rads 1, density D Last DXA: 07/08/21 T-score -1.5, normal FRAX Last colonoscopy: 09/24/20 10 year recall  Exercising: walking, 3 times a week Smoker:No  Flowsheet Row Office Visit from 07/04/2023 in [facility]  PHQ-2 Total Score 0      GYN HISTORY: No significant history  OB History  Gravida Para Term Preterm AB Living  2 1 1  1 1   SAB IAB Ectopic Multiple Live Births  1    1    # Outcome Date GA Lbr Len/2nd Weight Sex Type Anes PTL Lv  2 SAB           1 Term      Vag-Spont   LIV   Past Medical History:  Diagnosis Date   Cancer ([facility])    basal cell- left leg removed   Cataract    Hyperlipidemia    Hypertension    Migraines    history of, last one 2008   Osteopenia    Thyroid  disease    Past Surgical History:  Procedure Laterality Date   BREAST EXCISIONAL BIOPSY Left 08/17/2001   BREAST SURGERY Left    biospy   CATARACT EXTRACTION     left 06-04-2021 & rt 06-18-2021   COLONOSCOPY     LUMBAR LAMINECTOMY/DECOMPRESSION MICRODISCECTOMY Left 06/21/2012   Procedure: LUMBAR LAMINECTOMY/DECOMPRESSION MICRODISCECTOMY;  Surgeon: Oneil Rodgers Priestly, MD;  Location: [facility];  Service: Orthopedics;  Laterality: Left;  Left sided lumbar 5-sacrum 1 microdisectomy   NECK SURGERY  1982   right side- lymphnode removed   Current Outpatient Medications on File Prior to Visit  Medication Sig Dispense Refill   Calcium Citrate-Vitamin D  (CALCIUM + D PO) Take by mouth.     cholecalciferol  (VITAMIN D ) 1000 UNITS tablet Take 1,000 Units by mouth daily.     hydrochlorothiazide (HYDRODIURIL) 25 MG tablet Take 25 mg by mouth every morning.     hydrocortisone 2.5 % cream Apply 1 Application topically 2 (two) times daily.     ketorolac (ACULAR) 0.5 % ophthalmic solution Place 1 drop into the right eye 4 (four) times daily.     moxifloxacin (VIGAMOX) 0.5 % ophthalmic solution Place into the right eye.     OVER THE COUNTER MEDICATION Apply 1 drop to eye 2 (two) times daily as needed. For dry eyes. Hydro Tears     prednisoLONE acetate (PRED FORTE) 1 % ophthalmic suspension 1 drop 4 (four) times daily.     Probiotic Product (ALIGN PO) Take by mouth.     RINVOQ 15 MG TB24 Take 1 tablet by mouth daily.     rosuvastatin (CRESTOR) 10 MG tablet SMARTSIG:1 By Mouth     SYNTHROID 50 MCG tablet Take 1 tablet by mouth daily.  2   No current facility-administered medications on file prior to visit.   Social History   Socioeconomic History   Marital status: Widowed    Spouse name: Not on file   Number of children: Not on file   Years of education: Not on file   Highest  education level: Not on file  Occupational History   Not on file  Tobacco Use   Smoking status: Never   Smokeless tobacco: Never  Vaping Use   Vaping status: Never Used  Substance and Sexual Activity   Alcohol  use: Yes    Alcohol /week: 7.0 standard drinks of alcohol     Types: 7 Glasses of wine per week    Comment: wine   Drug use: No   Sexual activity: Not Currently    Birth control/protection: Post-menopausal    Comment: 1ST INTERCOURSE- 68, PARTNERS- 1  Other Topics Concern   Not on file  Social History Narrative   Not on file   Social Drivers of Health   Financial Resource Strain: Not on file  Food Insecurity: Not on file  Transportation Needs: Not on file  Physical Activity: Not on file  Stress: Not on file  Social Connections: Not on file  Intimate Partner Violence: Not on file   Family History   Problem Relation Age of Onset   Hypertension Mother    Breast cancer Mother 59       lump removed only   Colon cancer Maternal Aunt    Cancer Maternal Aunt        colon   Allergies  Allergen Reactions   Amlodipine Swelling   Biaxin [Clarithromycin]     REACTION: rash   Fish Oil     REACTION: itching   Olmesartan Diarrhea   Raloxifene     REACTION: leg cramps      PE Today's Vitals   07/04/23 1145  BP: (!) 150/64  Pulse: 60  Temp: 98 F (36.7 C)  TempSrc: Oral  SpO2: 99%  Weight: 133 lb (60.3 kg)  Height: 5' 4.5 (1.638 m)   Body mass index is 22.48 kg/m.  Physical Exam Vitals reviewed. Exam conducted with a chaperone present.  Constitutional:      General: She is not in acute distress.    Appearance: Normal appearance.  HENT:     Head: Normocephalic and atraumatic.     Nose: Nose normal.   Eyes:     Extraocular Movements: Extraocular movements intact.     Conjunctiva/sclera: Conjunctivae normal.   Neck:     Thyroid : No thyroid  mass, thyromegaly or thyroid  tenderness.  Pulmonary:     Effort: Pulmonary effort is normal.  Chest:     Chest wall: No mass or tenderness.  Breasts:    Right: Normal. No swelling, mass, nipple discharge, skin change or tenderness.     Left: Normal. No swelling, mass, nipple discharge, skin change or tenderness.  Abdominal:     General: There is no distension.     Palpations: Abdomen is soft.     Tenderness: There is no abdominal tenderness.  Genitourinary:    General: Normal vulva.     Exam position: Lithotomy position.     Urethra: No prolapse.     Vagina: Normal. No vaginal discharge or bleeding.     Cervix: Normal. No lesion.     Uterus: Normal. Not enlarged and not tender.      Adnexa: Right adnexa normal and left adnexa normal.      Comments: Thinner mucosa along posterior fourchette  Musculoskeletal:        General: Normal range of motion.     Cervical back: Normal range of motion.  Lymphadenopathy:     Upper  Body:     Right upper body: No axillary adenopathy.     Left upper  body: No axillary adenopathy.     Lower Body: No right inguinal adenopathy. No left inguinal adenopathy.   Skin:    General: Skin is warm and dry.   Neurological:     General: No focal deficit present.     Mental Status: She is alert.   Psychiatric:        Mood and Affect: Mood normal.        Behavior: Behavior normal.       Assessment and Plan:        Encounter for breast and pelvic examination Assessment & Plan: Cervical cancer screening performed according to ASCCP guidelines. Encouraged annual mammogram screening DXA due, ordered Labs and immunizations with her primary Encouraged safe sexual practices as indicated Encouraged healthy lifestyle practices with diet and exercise For patients over 70yo, I recommend 1200mg  calcium daily and 800IU of vitamin D  daily.    Negative depression screening  Osteopenia of necks of both femurs -     DG Bone Density; Future  Lichen sclerosus et atrophicus -     Clobetasol  Propionate; Apply 1 Application topically 2 (two) times a week. Thin application of affected vulva.  Dispense: 30 g; Refill: 4   Joven Mom LULLA Pa, MD

## 2023-07-04 NOTE — Patient Instructions (Signed)

## 2023-07-04 NOTE — Assessment & Plan Note (Signed)
 Cervical cancer screening performed according to ASCCP guidelines. Encouraged annual mammogram screening DXA due, ordered Labs and immunizations with her primary Encouraged safe sexual practices as indicated Encouraged healthy lifestyle practices with diet and exercise For patients over 78yo, I recommend 1200mg  calcium daily and 800IU of vitamin D  daily.

## 2023-08-04 DIAGNOSIS — Z961 Presence of intraocular lens: Secondary | ICD-10-CM | POA: Diagnosis not present

## 2023-08-04 DIAGNOSIS — H40013 Open angle with borderline findings, low risk, bilateral: Secondary | ICD-10-CM | POA: Diagnosis not present

## 2023-08-04 DIAGNOSIS — H04123 Dry eye syndrome of bilateral lacrimal glands: Secondary | ICD-10-CM | POA: Diagnosis not present

## 2023-08-23 DIAGNOSIS — L718 Other rosacea: Secondary | ICD-10-CM | POA: Diagnosis not present

## 2023-08-23 DIAGNOSIS — L578 Other skin changes due to chronic exposure to nonionizing radiation: Secondary | ICD-10-CM | POA: Diagnosis not present

## 2023-08-23 DIAGNOSIS — L2089 Other atopic dermatitis: Secondary | ICD-10-CM | POA: Diagnosis not present

## 2023-08-23 DIAGNOSIS — Z85828 Personal history of other malignant neoplasm of skin: Secondary | ICD-10-CM | POA: Diagnosis not present

## 2023-09-07 DIAGNOSIS — M545 Low back pain, unspecified: Secondary | ICD-10-CM | POA: Diagnosis not present

## 2023-09-12 DIAGNOSIS — M5416 Radiculopathy, lumbar region: Secondary | ICD-10-CM | POA: Diagnosis not present

## 2023-09-16 DIAGNOSIS — M5416 Radiculopathy, lumbar region: Secondary | ICD-10-CM | POA: Diagnosis not present

## 2023-09-20 DIAGNOSIS — M5416 Radiculopathy, lumbar region: Secondary | ICD-10-CM | POA: Diagnosis not present

## 2023-09-22 DIAGNOSIS — M5416 Radiculopathy, lumbar region: Secondary | ICD-10-CM | POA: Diagnosis not present

## 2023-09-27 DIAGNOSIS — M5416 Radiculopathy, lumbar region: Secondary | ICD-10-CM | POA: Diagnosis not present

## 2023-09-29 DIAGNOSIS — M5416 Radiculopathy, lumbar region: Secondary | ICD-10-CM | POA: Diagnosis not present

## 2023-10-04 DIAGNOSIS — M5416 Radiculopathy, lumbar region: Secondary | ICD-10-CM | POA: Diagnosis not present

## 2023-10-06 DIAGNOSIS — M5416 Radiculopathy, lumbar region: Secondary | ICD-10-CM | POA: Diagnosis not present

## 2023-10-28 DIAGNOSIS — M8580 Other specified disorders of bone density and structure, unspecified site: Secondary | ICD-10-CM | POA: Diagnosis not present

## 2023-10-28 DIAGNOSIS — E785 Hyperlipidemia, unspecified: Secondary | ICD-10-CM | POA: Diagnosis not present

## 2023-10-28 DIAGNOSIS — E039 Hypothyroidism, unspecified: Secondary | ICD-10-CM | POA: Diagnosis not present

## 2023-10-28 DIAGNOSIS — I10 Essential (primary) hypertension: Secondary | ICD-10-CM | POA: Diagnosis not present

## 2023-11-04 DIAGNOSIS — E039 Hypothyroidism, unspecified: Secondary | ICD-10-CM | POA: Diagnosis not present

## 2023-11-04 DIAGNOSIS — Z23 Encounter for immunization: Secondary | ICD-10-CM | POA: Diagnosis not present

## 2023-11-04 DIAGNOSIS — G720 Drug-induced myopathy: Secondary | ICD-10-CM | POA: Diagnosis not present

## 2023-11-04 DIAGNOSIS — Z1339 Encounter for screening examination for other mental health and behavioral disorders: Secondary | ICD-10-CM | POA: Diagnosis not present

## 2023-11-04 DIAGNOSIS — Z860101 Personal history of adenomatous and serrated colon polyps: Secondary | ICD-10-CM | POA: Diagnosis not present

## 2023-11-04 DIAGNOSIS — E785 Hyperlipidemia, unspecified: Secondary | ICD-10-CM | POA: Diagnosis not present

## 2023-11-04 DIAGNOSIS — M5416 Radiculopathy, lumbar region: Secondary | ICD-10-CM | POA: Diagnosis not present

## 2023-11-04 DIAGNOSIS — Z0000 Encounter for general adult medical examination without abnormal findings: Secondary | ICD-10-CM | POA: Diagnosis not present

## 2023-11-04 DIAGNOSIS — I10 Essential (primary) hypertension: Secondary | ICD-10-CM | POA: Diagnosis not present

## 2023-11-04 DIAGNOSIS — R82998 Other abnormal findings in urine: Secondary | ICD-10-CM | POA: Diagnosis not present

## 2023-11-04 DIAGNOSIS — M8580 Other specified disorders of bone density and structure, unspecified site: Secondary | ICD-10-CM | POA: Diagnosis not present

## 2023-11-04 DIAGNOSIS — D7589 Other specified diseases of blood and blood-forming organs: Secondary | ICD-10-CM | POA: Diagnosis not present

## 2023-11-04 DIAGNOSIS — Z1331 Encounter for screening for depression: Secondary | ICD-10-CM | POA: Diagnosis not present

## 2024-07-05 ENCOUNTER — Ambulatory Visit: Admitting: Obstetrics and Gynecology
